# Patient Record
Sex: Male | Born: 1945 | Race: White | Hispanic: No | Marital: Married | Smoking: Never smoker
Health system: Southern US, Community
[De-identification: ages and names within clinical notes are randomized; demographics above are authoritative.]

## PROBLEM LIST (undated history)

## (undated) HISTORY — PX: KNEE RECONSTRUCTION: SHX5883

## (undated) HISTORY — PX: GALLBLADDER SURGERY: SHX652

---

## 1999-02-25 ENCOUNTER — Encounter: Payer: Self-pay | Admitting: Cardiology

## 1999-02-26 ENCOUNTER — Inpatient Hospital Stay (HOSPITAL_COMMUNITY): Admission: RE | Admit: 1999-02-26 | Discharge: 1999-03-03 | Payer: Self-pay | Admitting: Cardiology

## 1999-02-26 ENCOUNTER — Encounter: Payer: Self-pay | Admitting: Surgery

## 1999-02-27 ENCOUNTER — Encounter: Payer: Self-pay | Admitting: Surgery

## 1999-03-24 ENCOUNTER — Encounter: Admission: RE | Admit: 1999-03-24 | Discharge: 1999-03-24 | Payer: Self-pay | Admitting: Surgery

## 1999-03-24 ENCOUNTER — Encounter: Payer: Self-pay | Admitting: Surgery

## 2000-02-11 ENCOUNTER — Ambulatory Visit (HOSPITAL_COMMUNITY): Admission: RE | Admit: 2000-02-11 | Discharge: 2000-02-11 | Payer: Self-pay | Admitting: Family Medicine

## 2009-09-08 ENCOUNTER — Encounter: Admission: RE | Admit: 2009-09-08 | Discharge: 2009-09-08 | Payer: Self-pay | Admitting: Sports Medicine

## 2009-09-22 ENCOUNTER — Encounter: Admission: RE | Admit: 2009-09-22 | Discharge: 2009-09-22 | Payer: Self-pay | Admitting: Sports Medicine

## 2010-05-29 NOTE — Cardiovascular Report (Signed)
[place]. [facility]  Patient:    Lawrence Morton, Lawrence Morton                          MRN: [id number] Proc. Date: 02/25/99 Adm. Date:  [id number] Attending:  Lenoria Farrier CC:         Belva Crome, M.D., [place], [place]             Everardo Beals. Juanda Chance, M.D. [facility]             Cardiac Catheterization Laboratory                        Cardiac Catheterization  INDICATIONS:  Lawrence Morton is 65 years old and has a history of chest pain dating back at least a year.  He had a negative Cardiolite scan about one year ago. Recently, his symptoms have increased and he saw Dr. Tomie China in consultation, ho recommended that he be evaluated further with cardiac catheterization.  He does  have multiple risk factors for coronary disease, including hypertension, hyperlipidemia, and a positive family history for coronary artery disease.  DESCRIPTION OF PROCEDURE:  The procedure was performed via the right femoral artery using arterial sheath and 6-French preformed coronary catheters.  We had a great deal of difficulty accessing the right groin even though we could feel the pulse well.  We were able to get into the artery, but could not maintain brisk flow. We finally gained access and there was no immediate problem evident.  We used 6-French preformed coronary catheters and had a great deal of difficulty selectively engaging the left coronary without damping.  We used a JL-4 and a JL-5 7-French and a JL-4 and a JL-3.5 5-French.  Subclavian injection was performed to assess the  internal mammary artery for its suitability for bypass grafting.  Distal aortogram was performed to rule out abdominal aortic aneurysm.  RESULTS:  CORONARY ANGIOGRAPHY: 1. The left main coronary artery was a very short vessel which appeared to be free    of significant obstruction.  2. The left anterior descending artery gave rise to a large diagonal branch, a    large septal perforator, two small  diagonal branches, and several more septal    perforators.  There was 90% stenosis just after the first septal perforator ith    an aneurysmal dilatation just after the stenosis.  3. The circumflex artery gave rise to an early marginal branch and two    posterolateral branches.  There were 50% and 70% stenoses in the marginal    branch.  There was 90% stenosis in the mid circumflex artery and 80% stenosis    in the proximal portion of the second posterolateral branch.  4. The right coronary artery was completely occluded proximally.  There were    bridging collaterals which filled the distal right coronary artery and the    posterior descending and one posterolateral branch filled antegrade.  There    were collaterals of the distal right coronary artery from the circumflex and the    LAD which filled a second posterolateral branch and part of the first    posterolateral branch.  LEFT VENTRICULOGRAM:  The left ventriculogram was performed in the RAO projection and showed good wall motion with no areas of hypokinesis.  The estimated ejection fraction was 60%.  DISTAL AORTOGRAM:  An aortogram was performed which showed no renal artery stenosis and no significant aortoiliac obstruction.  CONCLUSIONS:  Coronary artery disease with 90% stenosis in the proximal left anterior descending artery, 70% stenosis in the first marginal branch, 90% stenosis in the mid circumflex artery and total occlusion of the right coronary artery with normal left ventricular function.  RECOMMENDATIONS:  The patient has severe three-vessel disease and is quite symptomatic with angina.  His situation is not favorable for a percutaneous intervention and I would recommend bypass surgery.  I will discuss this with Dr. Tomie China, the patient, and his family. DD:  02/25/99 TD:  02/25/99 Job: [id number]

## 2010-05-29 NOTE — Discharge Summary (Signed)
[place]. [facility] [place]  Patient:    Lawrence Morton, Lawrence Morton                          MRN: [id number] Adm. Date:  02/25/99 Disc. Date: 03/03/99 Attending:  Alleen Borne, M.D. Dictator:   Carlye Grippe. CC:         Dr. Laneta Simmers             Dr. Juanda Chance [facility])             Dr. Yetta Flock [facility])             Dr. Aliene Beams, [place]                           Discharge Summary  HISTORY OF PRESENT ILLNESS:  This is a 65 year old male with apparent several-year history of chest pain who underwent exercise Cardiolite approximately one year go, which was negative.  Recently, he has had increased symptoms of chest tightness and shortness of breath on exertion, and was felt to require admission for cardiac catheterization.  Cardiac risk factors include family history, hypertension, hyperlipidemia, and obesity.  PAST MEDICAL HISTORY: 1. Hypertension. 2. History of bladder cancer, status post surgical resection. 3. History of skin cancer. 4. History of sleep apnea, on a BiPAP mask.  ALLERGIES:  No known allergies.  ADMISSION MEDICATIONS: 1. Tricor 200 mg q.d. 2. Cozaar 50 mg q.d. 3. Enteric-coated aspirin 1 q.d. 4. ______ 1 q.d. 5. Vitamin E 400 IU 1 q.d. 6. Folic acid 1 q.d. 7. Multivitamin 1 q.d.  SOCIAL HISTORY, FAMILY HISTORY, REVIEW OF SYMPTOMS, PHYSICAL EXAMINATION: Please see the history and physical done at the time of admission.  HOSPITAL COURSE:  The patient was admitted electively on February 25, 1999, and was taken to the cardiac catheterization laboratory by Dr. Juanda Chance, where he was found to have severe three vessel coronary artery disease including 90% proximal, 70%  circumflex intermediate, 90% mid circumflex, total right coronary artery.  Left  ventriculogram was within normal limits.  Ejection fraction was approximately 60%. Cardiac surgical opinion was obtained with Evelene Croon, M.D., who evaluated the patient and his studies, and agreed  that coronary artery bypass grafting is the  best treatment for this patient.  The surgery, alternatives, benefits, and risks were discussed with the patient, wife, and daughter, and he agreed to proceed with surgery.  PROCEDURE:  On February 25, 1998, the patient underwent the following procedure: Coronary artery bypass grafting x 4.  The following grafts were placed: 1. Left internal mammary artery to the LAD. 2. Saphenous vein graft to the posterior descending. 3. Saphenous vein graft to the intermediate and obtuse marginal in a sequential    manner.  Cross-clamp time was 67 minutes, pump time 109 minutes.  The patient tolerated he procedure well, was taken to the surgical intensive care unit in stable condition.  POSTOPERATIVE HOSPITAL COURSE:  The patient has done well.  He has maintained stable hemodynamics.  Postoperatively, he did have some EKG changes and a friction rub consistent with pericarditis and was started on a short course of Indocin.  This has shown good resolution.  Additionally, the patient had some right hand numbness that was felt probably secondary to a nerve stretch injury from the retractor and, again, this has also showed improvement.  Laboratory values have  remained stable.  He does have a hemoglobin and hematocrit dated February 27, 1999, of 10 and 30.  Electrolytes, BUN, and creatinine have remained stable.  He is afebrile.  He has maintained a normal sinus rhythm.  Oxygen has been weaned, and he maintains good saturations on room air.  He has undergone a gentle diuresis, and now is at the level of his preoperative weight.  His incisions are healing well  without signs of infection.  He is tolerating diet and activities commensurate or postoperative state and, overall, is felt to be tentatively stable for discharge on the morning of March 03, 1999, pending morning round re-evaluation.  FINAL DIAGNOSES: 1. Multivessel coronary artery  disease. 2. Hypertension. 3. Hyperlipidemia. 4. History of bladder cancer. 5. History of sleep apnea.  DISCHARGE MEDICATIONS: 1. Vitamin E 400 IU q.d. 2. Folvite 1 mg daily. 3. Tricor 200 mg q.d. 4. Atenolol 12.5 mg b.i.d. 5. Enteric-coated aspirin 325 mg q.d. 6. Tylox 1 or 2 q.4-6h. p.r.n.  FOLLOW-UP:  Staple removal on March 09, 1999, at 10 a.m. at the [facility] office.  The patient will also need to see Dr. Tomie China in two weeks, and Dr. Laneta Simmers will see the patient on March 24, 1999, at 10:30 a.m., with a chest x-ray from either his cardiologist or the [facility].  CONDITION ON DISCHARGE:  Stable and improving.  DISCHARGE INSTRUCTIONS:  The patient will receive written instructions regarding medications, activity, diet, wound care, and follow up. DD:  03/02/99 TD:  03/02/99 Job: [id number]

## 2010-05-29 NOTE — Op Note (Signed)
[place]. [facility]  Patient:    Lawrence Morton, Lawrence Morton                          MRN: [id number] Proc. Date: 02/26/99 Adm. Date:  [id number] Attending:  Cleatrice Burke CC:         Alleen Borne, M.D., CVTS Office             Bruce R. Juanda Chance, M.D. [facility]             Cardiac Cath Lab, [place]                           Operative Report  PREOPERATIVE DIAGNOSIS:  Severe 3-vessel coronary disease with progressive angina.  POSTOPERATIVE DIAGNOSIS:  Severe 3-vessel coronary disease with progressive angina.  PROCEDURE:  Median sternotomy, extracorporeal circulation, coronary artery bypass graft surgery x 4 using left internal mammary artery graft to left anterior descending coronary artery, with a saphenous vein graft to the posterior descending branch of the right coronary artery and a sequential saphenous vein graft to the intermediate and obtuse marginal of the left circumflex coronary artery.  SURGEON:  Alleen Borne, M.D.  ASSISTANTLuretha Rued. Vedia Pereyra. and Lissa Merlin, P.A.-C  ANESTHESIA:  General endotracheal.  CLINICAL HISTORY:  This patient is a 65 year old white male with a history of hypertension and hyperlipidemia, as well as a positive family history of heart disease, who presented with a severe year history of chest pain associated with  shortness of breath with exertion.  These symptoms have progressed.  Cardiac catheterization showed 90% proximal LAD stenosis.  The left circumflex had a 70% intermediate stenosis and then a 90% mid vessel stenosis before the obtuse marginal or posterolateral branch.  The right coronary artery was occluded with filling f the distal vessel by collaterals from the left.  Left ventricular function was normal.  After review of the angiogram and examination of the patient, it was felt that coronary artery bypass graft surgery was the best treatment.  I discussed he operative procedure, alternatives to  surgery, benefits and risks, including bleeding, possible blood transfusion, infection, stroke, myocardial infarction nd death with the patient and his wife and they understood and agreed to proceed with surgery.  OPERATIVE PROCEDURE:  The patient was taken to the operating room, placed on the table in supine position.  After induction of general endotracheal anesthesia,  Foley catheter was placed in bladder using sterile technique.  Then, the chest,  abdomen and both lower extremities were prepped and draped in usual sterile manner. The chest was entered through a median sternotomy incision and the pericardium as opened in the midline.  Examination of the heart showed good ventricular contractility.  The ascending aorta was relatively short, but had no plaque in t.  Then, the left internal mammary artery was harvested from the chest wall as a pedicle graft.  This was a medium caliber vessel with excellent blood flow through it.  At the same time, a segment of greater saphenous vein was harvested from the right lower leg and this vein was of medium size and good quality.  Then, the patient was heparinized and when an adequate activated clotting time as achieved, the distal ascending aorta was cannulated using a 22-French aortic cannula for arterial inflow.  Venous outflow was achieved using a 2-stage venous cannula through the right atrial appendage.  An antegrade cardioplegia  and vent  cannula was inserted in the aortic root.  The patient was placed on cardiopulmonary bypass and distal coronaries identified. The LAD was a large graftable vessel.  The diagonal branch had no disease in it and was supplied by the normal left main coronary artery.  The intermediate and obtuse marginal vessels were medium sized graftable vessels.  The right coronary artery gave off a single posterior descending branch, which was suitable for grafting.  There was an acute marginal branch,  which was deep beneath the epicardial fat and was not felt to be suitable for grafting.  Then the aorta was cross-clamped and 500 cc of cold blood antegrade cardioplegia was administered in the aortic root with quick arrest of the heart.  Systemic hypothermia to 20 degrees Centigrade and topical hypothermia with iced saline was used.  A temperature probe was placed in the septum and insulating pad in the pericardium.  The first distal anastomosis was performed to the posterior descending coronary  artery.  The internal diameter was 1.6 mm.  The conduit used was a segment of greater saphenous vein and the anastomosis performed in an end-to-side manner using continuous 7-0 Prolene suture.  Flow is measured through the graft and was excellent.  A second distal anastomosis was performed to the intermediate coronary artery. The internal diameter was 1.6 mm.  The conduit used was a second segment of greater  saphenous vein and anastomosis was performed in a sequential side-to-side manner using continuous 7-0 Prolene suture.  Flow was measured through the graft and was excellent.  Then the third distal anastomosis was performed to the obtuse marginal branch.  The internal diameter was 1.6 mm.  The conduit used was the same segment of greater saphenous vein and the anastomosis performed in a sequential end-to-side manner  using continuous 7-0 Prolene suture.  Flow was measured through the graft and was excellent.  Then another dose of cardioplegia was given down the vein grafts and in the aortic root.  The fourth distal anastomosis was performed to the mid portion of the left anterior descending coronary artery.  The internal diameter was 1.75 mm.  The conduit used was the left internal mammary artery and this was brought through an opening in the left pericardium anterior to the phrenic nerve.  It was anastomosed to the LAD n an end-to-side manner using continuous 8-0  Prolene suture.  The pedicle was tacked to epicardium with 6-0 Prolene sutures.  The patient was rewarmed to 37 degrees   Centigrade and the clamp was removed from the mammary pedicle.  There was rapid  warming of ventricular septum and return of spontaneous ventricular fibrillation. The cross-clamp was removed with a time of 67 minutes and the patient defibrillated in sinus rhythm.  A partial occlusion clamp was placed on the aortic root and the two proximal vein graft anastomoses were performed in an end-to-side manner using continuous 6-0 Prolene suture.  The clamp was removed and the vein grafts deaired and the clamps removed from them.  The proximal and distal anastomoses appeared hemostatic and the lying of the grafts satisfactory.  Graft markers were placed around the proximal anastomoses.  Two temporary right ventricular and right atrial pacing wires were placed and brought out through the skin.  When the patient had rewarmed to 37 degrees Centigrade, he was weaned from cardiopulmonary bypass on no inotropic agents.  Total bypass time was 109 minutes. Cardiac function appeared excellent with a cardiac output of 7 L/min. Protamine was given  and the venous and aortic cannulae removed without difficulty. Hemostasis was achieved.  Three chest tubes were placed with a tube in the posterior pericardium, one in the left pleural space and one in the anterior mediastinum.  The pericardium was reapproximated over the heart.  The sternum was closed with #6 stainless steel wires.  Fascia was closed with continuous #1 Vicryl suture.  Subcutaneous tissue was closed using continuous 2-0 Vicryl and the skin with 3-0 Vicryl subcuticular closure.  Lower extremity vein harvest site was closed layers in a similar manner.  The sponge, needle and instrument counts were correct according to scrub nurse.  Dry sterile dressings were applied over the incisions around the chest tubes,  which were hooked to Pleur-evac suction.  The patient remained hemodynamically stable and was transported to the SICU in guarded, but  stable condition. DD:  02/26/99 TD:  02/26/99 Job: [id number]

## 2014-09-12 DIAGNOSIS — Z9989 Dependence on other enabling machines and devices: Secondary | ICD-10-CM

## 2014-09-12 DIAGNOSIS — M48061 Spinal stenosis, lumbar region without neurogenic claudication: Secondary | ICD-10-CM | POA: Insufficient documentation

## 2014-09-12 DIAGNOSIS — G63 Polyneuropathy in diseases classified elsewhere: Secondary | ICD-10-CM | POA: Insufficient documentation

## 2014-09-12 DIAGNOSIS — G729 Myopathy, unspecified: Secondary | ICD-10-CM | POA: Insufficient documentation

## 2014-09-12 DIAGNOSIS — R269 Unspecified abnormalities of gait and mobility: Secondary | ICD-10-CM | POA: Insufficient documentation

## 2014-09-12 DIAGNOSIS — G4733 Obstructive sleep apnea (adult) (pediatric): Secondary | ICD-10-CM | POA: Insufficient documentation

## 2015-10-29 DIAGNOSIS — Z9181 History of falling: Secondary | ICD-10-CM | POA: Insufficient documentation

## 2016-07-18 DIAGNOSIS — I2510 Atherosclerotic heart disease of native coronary artery without angina pectoris: Secondary | ICD-10-CM | POA: Insufficient documentation

## 2016-07-18 DIAGNOSIS — R0609 Other forms of dyspnea: Secondary | ICD-10-CM | POA: Insufficient documentation

## 2016-07-18 DIAGNOSIS — Z951 Presence of aortocoronary bypass graft: Secondary | ICD-10-CM | POA: Insufficient documentation

## 2017-07-13 ENCOUNTER — Ambulatory Visit (INDEPENDENT_AMBULATORY_CARE_PROVIDER_SITE_OTHER): Payer: Medicare Other | Admitting: Cardiology

## 2017-07-13 ENCOUNTER — Encounter: Payer: Self-pay | Admitting: Cardiology

## 2017-07-13 ENCOUNTER — Encounter (INDEPENDENT_AMBULATORY_CARE_PROVIDER_SITE_OTHER): Payer: Self-pay

## 2017-07-13 VITALS — BP 134/82 | HR 72 | Ht 70.0 in | Wt 271.0 lb

## 2017-07-13 DIAGNOSIS — Z9181 History of falling: Secondary | ICD-10-CM | POA: Diagnosis not present

## 2017-07-13 DIAGNOSIS — R269 Unspecified abnormalities of gait and mobility: Secondary | ICD-10-CM | POA: Diagnosis not present

## 2017-07-13 DIAGNOSIS — Z951 Presence of aortocoronary bypass graft: Secondary | ICD-10-CM | POA: Diagnosis not present

## 2017-07-13 DIAGNOSIS — I483 Typical atrial flutter: Secondary | ICD-10-CM | POA: Diagnosis not present

## 2017-07-13 DIAGNOSIS — I4892 Unspecified atrial flutter: Secondary | ICD-10-CM | POA: Insufficient documentation

## 2017-07-13 DIAGNOSIS — I2510 Atherosclerotic heart disease of native coronary artery without angina pectoris: Secondary | ICD-10-CM | POA: Diagnosis not present

## 2017-07-13 DIAGNOSIS — I480 Paroxysmal atrial fibrillation: Secondary | ICD-10-CM

## 2017-07-13 NOTE — Patient Instructions (Signed)
Medication Instructions:  Your physician recommends that you continue on your current medications as directed. Please refer to the Current Medication list given to you today.  Labwork: None  Testing/Procedures: Your physician has requested that you have an echocardiogram. Echocardiography is a painless test that uses sound waves to create images of your heart. It provides your doctor with information about the size and shape of your heart and how well your heart's chambers and valves are working. This procedure takes approximately one hour. There are no restrictions for this procedure.  Your physician has recommended that you wear a holter monitor. Holter monitors are medical devices that record the heart's electrical activity. Doctors most often use these monitors to diagnose arrhythmias. Arrhythmias are problems with the speed or rhythm of the heartbeat. The monitor is a small, portable device. You can wear one while you do your normal daily activities. This is usually used to diagnose what is causing palpitations/syncope (passing out).  Follow-Up: Your physician recommends that you schedule a follow-up appointment in: 3 weeks  Any Other Special Instructions Will Be Listed Below (If Applicable).     If you need a refill on your cardiac medications before your next appointment, please call your pharmacy.   [facility]  Garey HamAshley A, RN, BSN

## 2017-07-13 NOTE — Progress Notes (Signed)
Cardiology Office Note:    Date:  07/13/2017   ID:  [name], DOB 10/11/1945, MRN [id number]  PCP:  Hal Morales, NP  Cardiologist:  Gypsy Balsam, MD    Referring MD: No ref. provider found   No chief complaint on file. Doing well  History of Present Illness:    Lawrence Morton is a 72 y.o. male with coronary artery disease sleep apnea essential hypertension comes today to office for follow-up he also does have some neurological problem with his legs he cannot walk well denies having any chest pain tightness squeezing pressure burning chest.  EKG was done today surprisingly he is in atrial flutter he is completely unaware of it.  He does have frequent falls because of his neurological problem.  Therefore I do think he be candidate for anticoagulation.  History reviewed. No pertinent past medical history.  Past Surgical History:  Procedure Laterality Date  . GALLBLADDER SURGERY    . KNEE RECONSTRUCTION      Current Medications: Current Meds  Medication Sig  . aspirin EC 325 MG tablet Take 325 mg by mouth daily.   . clopidogrel (PLAVIX) 75 MG tablet Take 1 tablet by mouth daily.  . Coenzyme Q-10 200 MG CAPS Take 1 tablet by mouth daily.  . DOCOSAHEXAENOIC ACID PO Take 1 tablet by mouth daily.  . finasteride (PROSCAR) 5 MG tablet Take 1 tablet by mouth daily.  Marland Kitchen gabapentin (NEURONTIN) 300 MG capsule Take 300 mg by mouth daily.   . isosorbide mononitrate (IMDUR) 60 MG 24 hr tablet Take 1 tablet by mouth daily.  Marland Kitchen levothyroxine (SYNTHROID, LEVOTHROID) 50 MCG tablet Take 1 tablet by mouth daily.  Marland Kitchen lisinopril (PRINIVIL,ZESTRIL) 20 MG tablet Take 10 mg by mouth daily.  . metoprolol succinate (TOPROL-XL) 25 MG 24 hr tablet Take 1 tablet by mouth daily.  . montelukast (SINGULAIR) 10 MG tablet Take 1 tablet by mouth daily.  . Multiple Vitamin (MULTIVITAMIN) capsule Take 1 capsule by mouth daily.  . nitroGLYCERIN (NITROSTAT) 0.4 MG SL tablet Place 1 tablet under the tongue as  needed for chest pain.  . ranolazine (RANEXA) 1000 MG SR tablet Take 1 tablet by mouth daily.  . saxagliptin HCl (ONGLYZA) 5 MG TABS tablet Take 1 tablet by mouth daily.  . simvastatin (ZOCOR) 20 MG tablet Take 1 tablet by mouth daily.  . temazepam (RESTORIL) 15 MG capsule Take 1 capsule by mouth daily.     Allergies:   Patient has no known allergies.   Social History   Socioeconomic History  . Marital status: Married    Spouse name: Not on file  . Number of children: Not on file  . Years of education: Not on file  . Highest education level: Not on file  Occupational History  . Not on file  Social Needs  . Financial resource strain: Not on file  . Food insecurity:    Worry: Not on file    Inability: Not on file  . Transportation needs:    Medical: Not on file    Non-medical: Not on file  Tobacco Use  . Smoking status: Never Smoker  . Smokeless tobacco: Never Used  Substance and Sexual Activity  . Alcohol use: Not on file  . Drug use: Not on file  . Sexual activity: Not on file  Lifestyle  . Physical activity:    Days per week: Not on file    Minutes per session: Not on file  . Stress: Not on  file  Relationships  . Social connections:    Talks on phone: Not on file    Gets together: Not on file    Attends religious service: Not on file    Active member of club or organization: Not on file    Attends meetings of clubs or organizations: Not on file    Relationship status: Not on file  Other Topics Concern  . Not on file  Social History Narrative  . Not on file     Family History: The patient's family history includes Depression in his sister; Hypertension in his father, mother, and sister. ROS:   Please see the history of present illness.    All 14 point review of systems negative except as described per history of present illness  EKGs/Labs/Other Studies Reviewed:      Recent Labs: No results found for requested labs within last 8760 hours.  Recent Lipid  Panel No results found for: CHOL, TRIG, HDL, CHOLHDL, VLDL, LDLCALC, LDLDIRECT  Physical Exam:    VS:  BP 134/82 (BP Location: Right Arm, Patient Position: Sitting, Cuff Size: Normal)   Pulse 72   Ht 5\' 10"  (1.778 m)   Wt 271 lb (122.9 kg)   SpO2 99%   BMI 38.88 kg/m     Wt Readings from Last 3 Encounters:  07/13/17 271 lb (122.9 kg)     GEN:  Well nourished, well developed in no acute distress HEENT: Normal NECK: No JVD; No carotid bruits LYMPHATICS: No lymphadenopathy CARDIAC: RRR, no murmurs, no rubs, no gallops RESPIRATORY:  Clear to auscultation without rales, wheezing or rhonchi  ABDOMEN: Soft, non-tender, non-distended MUSCULOSKELETAL:  No edema; No deformity  SKIN: Warm and dry LOWER EXTREMITIES: no swelling NEUROLOGIC:  Alert and oriented x 3 PSYCHIATRIC:  Normal affect   ASSESSMENT:    1. Coronary artery disease involving native coronary artery of native heart without angina pectoris   2. Typical atrial flutter (HCC)   3. Hx of CABG   4. Gait difficulty   5. Risk for falls    PLAN:    In order of problems listed above:  1. Coronary artery disease stable on appropriate medications I reviewed all laboratory tests and data from a different office apparently he had a stress test which showed small area of ischemia medical therapy has been recommended.  He has been doing well with it and I will continue with it. 2. Atrial flutter which is incidental discovery.  He is asymptomatic.  Rate is controlled I will ask him to come back to my office in about 2 to 3 weeks to see if he still in atrial flutter at that time we talked about options for this situation again likely his ventricular rate is controlled.  I do not think he would be a good candidate for anticoagulation in spite of high chads 2 Vascor because of frequent falls we may consider watchman device in the future. 3. History of coronary artery disease status post coronary artery bypass graft doing well from that  point review 4. Gait difficulties with risk of falls being followed by neurology.  I will ask her to have echocardiogram.  I will bring him back to my office in 3 weeks.  Holter monitor for 48 hours will be put as well to assess high rate variability.  He described to episode of falls not recently I will make sure there is no significant slowing down his heart rate.   Medication Adjustments/Labs and Tests Ordered: Current medicines  are reviewed at length with the patient today.  Concerns regarding medicines are outlined above.  No orders of the defined types were placed in this encounter.  Medication changes: No orders of the defined types were placed in this encounter.   Signed, Georgeanna Lea, MD, [facility] 07/13/2017 4:24 PM    [facility]

## 2017-07-25 ENCOUNTER — Ambulatory Visit (INDEPENDENT_AMBULATORY_CARE_PROVIDER_SITE_OTHER): Payer: Medicare Other

## 2017-07-25 DIAGNOSIS — I480 Paroxysmal atrial fibrillation: Secondary | ICD-10-CM

## 2017-07-28 ENCOUNTER — Ambulatory Visit (INDEPENDENT_AMBULATORY_CARE_PROVIDER_SITE_OTHER): Payer: Medicare Other

## 2017-07-28 ENCOUNTER — Other Ambulatory Visit: Payer: Self-pay

## 2017-07-28 DIAGNOSIS — I480 Paroxysmal atrial fibrillation: Secondary | ICD-10-CM

## 2017-07-28 MED ORDER — PERFLUTREN LIPID MICROSPHERE
10.0000 mL | INTRAVENOUS | Status: AC | PRN
  Administered 2017-07-28: 10 mL via INTRAVENOUS

## 2017-07-28 NOTE — Progress Notes (Signed)
Complete echocardiogram with contrast was performed.   Jimmy Twania Bujak RDCS 

## 2017-07-29 ENCOUNTER — Encounter (INDEPENDENT_AMBULATORY_CARE_PROVIDER_SITE_OTHER): Payer: Self-pay

## 2017-07-31 ENCOUNTER — Encounter: Payer: Self-pay | Admitting: Cardiology

## 2017-07-31 DIAGNOSIS — R001 Bradycardia, unspecified: Secondary | ICD-10-CM | POA: Diagnosis not present

## 2017-07-31 DIAGNOSIS — I2510 Atherosclerotic heart disease of native coronary artery without angina pectoris: Secondary | ICD-10-CM | POA: Diagnosis not present

## 2017-07-31 DIAGNOSIS — R4182 Altered mental status, unspecified: Secondary | ICD-10-CM

## 2017-07-31 DIAGNOSIS — E871 Hypo-osmolality and hyponatremia: Secondary | ICD-10-CM | POA: Diagnosis not present

## 2017-07-31 DIAGNOSIS — I4892 Unspecified atrial flutter: Secondary | ICD-10-CM

## 2017-07-31 DIAGNOSIS — E119 Type 2 diabetes mellitus without complications: Secondary | ICD-10-CM | POA: Diagnosis not present

## 2017-08-01 DIAGNOSIS — R001 Bradycardia, unspecified: Secondary | ICD-10-CM | POA: Diagnosis not present

## 2017-08-01 DIAGNOSIS — I4892 Unspecified atrial flutter: Secondary | ICD-10-CM | POA: Diagnosis not present

## 2017-08-01 DIAGNOSIS — R4182 Altered mental status, unspecified: Secondary | ICD-10-CM | POA: Diagnosis not present

## 2017-08-03 ENCOUNTER — Ambulatory Visit (INDEPENDENT_AMBULATORY_CARE_PROVIDER_SITE_OTHER): Payer: Medicare Other | Admitting: Cardiology

## 2017-08-03 ENCOUNTER — Encounter: Payer: Self-pay | Admitting: Cardiology

## 2017-08-03 VITALS — BP 116/64 | HR 79 | Ht 70.0 in | Wt 273.0 lb

## 2017-08-03 DIAGNOSIS — I484 Atypical atrial flutter: Secondary | ICD-10-CM

## 2017-08-03 DIAGNOSIS — Z951 Presence of aortocoronary bypass graft: Secondary | ICD-10-CM | POA: Diagnosis not present

## 2017-08-03 DIAGNOSIS — Z9989 Dependence on other enabling machines and devices: Secondary | ICD-10-CM

## 2017-08-03 DIAGNOSIS — I2510 Atherosclerotic heart disease of native coronary artery without angina pectoris: Secondary | ICD-10-CM

## 2017-08-03 DIAGNOSIS — R404 Transient alteration of awareness: Secondary | ICD-10-CM

## 2017-08-03 DIAGNOSIS — G4733 Obstructive sleep apnea (adult) (pediatric): Secondary | ICD-10-CM | POA: Diagnosis not present

## 2017-08-03 DIAGNOSIS — Z9181 History of falling: Secondary | ICD-10-CM

## 2017-08-03 DIAGNOSIS — R4182 Altered mental status, unspecified: Secondary | ICD-10-CM | POA: Insufficient documentation

## 2017-08-03 NOTE — Progress Notes (Signed)
Cardiology Office Note:    Date:  08/03/2017   ID:  Lawrence Morton, DOB [birth year], MRN [id number]  PCP:  Hal Morales, NP  Cardiologist:  Gypsy Balsam, MD    Referring MD: Hal Morales, NP   Chief Complaint  Patient presents with  . 3 week follow up  I was in the hospital  History of Present Illness:    Lawrence Morton is a 72 y.o. male with atrial flutter, coronary artery disease, status post coronary artery bypass graft, obstructive sleep apnea comes to my office after being in the hospital a week and actually I did see him on Sunday he came because of altered mental status he was at the church she was sitting and then he  stop responding.  He was sitting on the chair never fell off the chair he he did have his eyes open his wife is going to talk to him he did not respond entire episode lasted for about 15 to 20 minutes EMS came and he was brought to the hospital in the hospital he was find to be in atrial flutter with slow ventricular rate and this is an issue that I interrogated and investigated previously he did have echocardiogram done which showed normal left ventricular ejection fraction.  He also got a Holter monitor that he wear for 48 hours lowest heart rate was 3 3, average 55 bpm overall slow and while in the hospital we discontinue his metoprolol he was taking only 12.5 twice daily since that time he is doing well denies have any chest pain tightness squeezing pressure burning the chest.  No past medical history on file.  Past Surgical History:  Procedure Laterality Date  . GALLBLADDER SURGERY    . KNEE RECONSTRUCTION      Current Medications: Current Meds  Medication Sig  . aspirin EC 325 MG tablet Take 325 mg by mouth daily.   . clopidogrel (PLAVIX) 75 MG tablet Take 1 tablet by mouth daily.  . Coenzyme Q-10 200 MG CAPS Take 1 tablet by mouth daily.  . DOCOSAHEXAENOIC ACID PO Take 1 tablet by mouth daily.  . finasteride (PROSCAR) 5 MG tablet Take 1 tablet by  mouth daily.  Marland Kitchen gabapentin (NEURONTIN) 300 MG capsule Take 300 mg by mouth daily.   . isosorbide mononitrate (IMDUR) 60 MG 24 hr tablet Take 1 tablet by mouth daily.  Marland Kitchen levothyroxine (SYNTHROID, LEVOTHROID) 50 MCG tablet Take 1 tablet by mouth daily.  Marland Kitchen lisinopril (PRINIVIL,ZESTRIL) 20 MG tablet Take 10 mg by mouth daily.  . montelukast (SINGULAIR) 10 MG tablet Take 1 tablet by mouth daily.  . Multiple Vitamin (MULTIVITAMIN) capsule Take 1 capsule by mouth daily.  . nitroGLYCERIN (NITROSTAT) 0.4 MG SL tablet Place 1 tablet under the tongue as needed for chest pain.  . ranolazine (RANEXA) 1000 MG SR tablet Take 1 tablet by mouth daily.  . saxagliptin HCl (ONGLYZA) 5 MG TABS tablet Take 1 tablet by mouth daily.  . simvastatin (ZOCOR) 20 MG tablet Take 1 tablet by mouth daily.  . temazepam (RESTORIL) 15 MG capsule Take 1 capsule by mouth daily.     Allergies:   Patient has no known allergies.   Social History   Socioeconomic History  . Marital status: Married    Spouse name: Not on file  . Number of children: Not on file  . Years of education: Not on file  . Highest education level: Not on file  Occupational History  . Not on  file  Social Needs  . Financial resource strain: Not on file  . Food insecurity:    Worry: Not on file    Inability: Not on file  . Transportation needs:    Medical: Not on file    Non-medical: Not on file  Tobacco Use  . Smoking status: Never Smoker  . Smokeless tobacco: Never Used  Substance and Sexual Activity  . Alcohol use: Not on file  . Drug use: Not on file  . Sexual activity: Not on file  Lifestyle  . Physical activity:    Days per week: Not on file    Minutes per session: Not on file  . Stress: Not on file  Relationships  . Social connections:    Talks on phone: Not on file    Gets together: Not on file    Attends religious service: Not on file    Active member of club or organization: Not on file    Attends meetings of clubs or  organizations: Not on file    Relationship status: Not on file  Other Topics Concern  . Not on file  Social History Narrative  . Not on file     Family History: The patient's family history includes Depression in his sister; Hypertension in his father, mother, and sister. ROS:   Please see the history of present illness.    All 14 point review of systems negative except as described per history of present illness  EKGs/Labs/Other Studies Reviewed:      Recent Labs: No results found for requested labs within last 8760 hours.  Recent Lipid Panel No results found for: CHOL, TRIG, HDL, CHOLHDL, VLDL, LDLCALC, LDLDIRECT  Physical Exam:    VS:  BP 116/64   Pulse 79   Ht 5\' 10"  (1.778 m)   Wt 273 lb (123.8 kg)   SpO2 98%   BMI 39.17 kg/m     Wt Readings from Last 3 Encounters:  08/03/17 273 lb (123.8 kg)  07/13/17 271 lb (122.9 kg)     GEN:  Well nourished, well developed in no acute distress HEENT: Normal NECK: No JVD; No carotid bruits LYMPHATICS: No lymphadenopathy CARDIAC: RRR, no murmurs, no rubs, no gallops RESPIRATORY:  Clear to auscultation without rales, wheezing or rhonchi  ABDOMEN: Soft, non-tender, non-distended MUSCULOSKELETAL:  No edema; No deformity  SKIN: Warm and dry LOWER EXTREMITIES: no swelling NEUROLOGIC:  Alert and oriented x 3 PSYCHIATRIC:  Normal affect   ASSESSMENT:    1. Atypical atrial flutter (HCC)   2. Coronary artery disease involving native coronary artery of native heart without angina pectoris   3. OSA on CPAP   4. Hx of CABG   5. Risk for falls   6. Transient alteration of awareness    PLAN:    In order of problems listed above:  1. Atypical atrial flutter rate appears to be controlled I will do EKG today to confirm the rhythm obviously he need to be anticoagulated because but because of unsteady gait and frequent falls we cannot do it I started conversation about watchman device with him.  He is taking aspirin as well as  Plavix I will continue we will repeat his EKG today and I will see him back in my office in about 1 month. 2. Altered mental status.  He is scheduled to see neurology what I think is an excellent idea.  We discontinue beta-blocker to eliminate one potential reason for his symptoms meaning significant bradycardia typically with bradycardia  people have more abrupt episodes rather than being unaware of surrounding for about 20 minutes. History of CABG doing well from that point review Risk of fall: Not anticoagulated.   Medication Adjustments/Labs and Tests Ordered: Current medicines are reviewed at length with the patient today.  Concerns regarding medicines are outlined above.  No orders of the defined types were placed in this encounter.  Medication changes: No orders of the defined types were placed in this encounter.   Signed, Georgeanna Leaobert J. Krasowski, MD, [facility] 08/03/2017 3:49 PM    [facility]

## 2017-08-03 NOTE — Patient Instructions (Signed)
Medication Instructions:  Your physician recommends that you continue on your current medications as directed. Please refer to the Current Medication list given to you today.  Labwork: None  Testing/Procedures: None  Follow-Up: Your physician recommends that you schedule a follow-up appointment in: 1 months  Any Other Special Instructions Will Be Listed Below (If Applicable).     If you need a refill on your cardiac medications before your next appointment, please call your pharmacy.   [facility]  Garey HamAshley A, RN, BSN

## 2017-09-05 ENCOUNTER — Ambulatory Visit: Payer: Medicare Other | Admitting: Cardiology

## 2017-09-05 ENCOUNTER — Encounter: Payer: Self-pay | Admitting: Cardiology

## 2017-09-05 VITALS — BP 126/58 | HR 68 | Ht 70.0 in | Wt 268.8 lb

## 2017-09-05 DIAGNOSIS — I2510 Atherosclerotic heart disease of native coronary artery without angina pectoris: Secondary | ICD-10-CM

## 2017-09-05 DIAGNOSIS — Z9181 History of falling: Secondary | ICD-10-CM | POA: Diagnosis not present

## 2017-09-05 DIAGNOSIS — I484 Atypical atrial flutter: Secondary | ICD-10-CM | POA: Diagnosis not present

## 2017-09-05 DIAGNOSIS — Z951 Presence of aortocoronary bypass graft: Secondary | ICD-10-CM | POA: Diagnosis not present

## 2017-09-05 NOTE — Progress Notes (Signed)
Cardiology Office Note:    Date:  09/05/2017   ID:  Lawrence Morton, DOB 01/08/1946, MRN [id number]  PCP:  Hal Morales, NP  Cardiologist:  Gypsy Balsam, MD    Referring MD: Hal Morales, NP   Chief Complaint  Patient presents with  . 1 month follow up  Doing well  History of Present Illness:    Lawrence Morton is a 72 y.o. male with coronary artery disease, atrial flutter.  He also has recent TIA.  He is on aspirin as well as Plavix ideally need to be anticoagulated because of high chads 2 Vascor but because of unsteady gait and frequent falls this is the case.  Denies having a tightness squeezing pressure branches described to have some atypical pain on the left side it happened when he walks to be.  Last time I saw him we spoke about watchman device I wanted him to think about it today he does not remember anything about the conversation.  We started talking about this again I gave him brochure and asked him to think about it.  I explained to him the indications for this device and potential complications.  He is not ready to make a decision today.  No past medical history on file.  Past Surgical History:  Procedure Laterality Date  . GALLBLADDER SURGERY    . KNEE RECONSTRUCTION      Current Medications: Current Meds  Medication Sig  . aspirin EC 325 MG tablet Take 325 mg by mouth daily.   . clopidogrel (PLAVIX) 75 MG tablet Take 1 tablet by mouth daily.  . Coenzyme Q-10 200 MG CAPS Take 1 tablet by mouth daily.  . DOCOSAHEXAENOIC ACID PO Take 1 tablet by mouth daily.  . finasteride (PROSCAR) 5 MG tablet Take 1 tablet by mouth daily.  Marland Kitchen gabapentin (NEURONTIN) 300 MG capsule Take 300 mg by mouth daily.   . isosorbide mononitrate (IMDUR) 60 MG 24 hr tablet Take 1 tablet by mouth daily.  Marland Kitchen levothyroxine (SYNTHROID, LEVOTHROID) 50 MCG tablet Take 1 tablet by mouth daily.  Marland Kitchen lisinopril (PRINIVIL,ZESTRIL) 20 MG tablet Take 10 mg by mouth daily.  . montelukast (SINGULAIR) 10 MG  tablet Take 1 tablet by mouth daily.  . Multiple Vitamin (MULTIVITAMIN) capsule Take 1 capsule by mouth daily.  . nitroGLYCERIN (NITROSTAT) 0.4 MG SL tablet Place 1 tablet under the tongue as needed for chest pain.  . ranolazine (RANEXA) 1000 MG SR tablet Take 1 tablet by mouth daily.  . saxagliptin HCl (ONGLYZA) 5 MG TABS tablet Take 1 tablet by mouth daily.  . simvastatin (ZOCOR) 20 MG tablet Take 1 tablet by mouth daily.  . temazepam (RESTORIL) 15 MG capsule Take 1 capsule by mouth daily.     Allergies:   Patient has no known allergies.   Social History   Socioeconomic History  . Marital status: Married    Spouse name: Not on file  . Number of children: Not on file  . Years of education: Not on file  . Highest education level: Not on file  Occupational History  . Not on file  Social Needs  . Financial resource strain: Not on file  . Food insecurity:    Worry: Not on file    Inability: Not on file  . Transportation needs:    Medical: Not on file    Non-medical: Not on file  Tobacco Use  . Smoking status: Never Smoker  . Smokeless tobacco: Never Used  Substance and  Sexual Activity  . Alcohol use: Not on file  . Drug use: Not on file  . Sexual activity: Not on file  Lifestyle  . Physical activity:    Days per week: Not on file    Minutes per session: Not on file  . Stress: Not on file  Relationships  . Social connections:    Talks on phone: Not on file    Gets together: Not on file    Attends religious service: Not on file    Active member of club or organization: Not on file    Attends meetings of clubs or organizations: Not on file    Relationship status: Not on file  Other Topics Concern  . Not on file  Social History Narrative  . Not on file     Family History: The patient's family history includes Depression in his sister; Hypertension in his father, mother, and sister. ROS:   Please see the history of present illness.    All 14 point review of systems  negative except as described per history of present illness  EKGs/Labs/Other Studies Reviewed:      Recent Labs: No results found for requested labs within last 8760 hours.  Recent Lipid Panel No results found for: CHOL, TRIG, HDL, CHOLHDL, VLDL, LDLCALC, LDLDIRECT  Physical Exam:    VS:  BP (!) 126/58   Pulse 68   Ht 5\' 10"  (1.778 m)   Wt 268 lb 12.8 oz (121.9 kg)   SpO2 98%   BMI 38.57 kg/m     Wt Readings from Last 3 Encounters:  09/05/17 268 lb 12.8 oz (121.9 kg)  08/03/17 273 lb (123.8 kg)  07/13/17 271 lb (122.9 kg)     GEN:  Well nourished, well developed in no acute distress HEENT: Normal NECK: No JVD; No carotid bruits LYMPHATICS: No lymphadenopathy CARDIAC: RRR, no murmurs, no rubs, no gallops RESPIRATORY:  Clear to auscultation without rales, wheezing or rhonchi  ABDOMEN: Soft, non-tender, non-distended MUSCULOSKELETAL:  No edema; No deformity  SKIN: Warm and dry LOWER EXTREMITIES: no swelling NEUROLOGIC:  Alert and oriented x 3 PSYCHIATRIC:  Normal affect   ASSESSMENT:    1. Coronary artery disease involving native coronary artery of native heart without angina pectoris   2. Atypical atrial flutter (HCC)   3. Hx of CABG   4. Risk for falls    PLAN:    In order of problems listed above:  1. Coronary artery disease stable on appropriate medications which I will continue. 2. Atypical atrial flutter, conversation about watchman device. 3. History of coronary artery bypass graft doing well from that point review 4. Risk of fall which prevented us from eating him anticoagulation in spite of high chads 2 Vascor.  See him back in my office in about 2 months or sooner if he get a problem   Medication Adjustments/Labs and Tests Ordered: Current medicines are reviewed at length with the patient today.  Concerns regarding medicines are outlined above.  No orders of the defined types were placed in this encounter.  Medication changes: No orders of the  defined types were placed in this encounter.   Signed, Georgeanna Leaobert J. Zarif Rathje, MD, [facility] 09/05/2017 4:00 PM    [facility]

## 2017-09-05 NOTE — Patient Instructions (Signed)
Medication Instructions:  Your physician recommends that you continue on your current medications as directed. Please refer to the Current Medication list given to you today.   Labwork: None  Testing/Procedures: None  Follow-Up: Your physician recommends that you schedule a follow-up appointment in: 2 months.  If you need a refill on your cardiac medications before your next appointment, please call your pharmacy.   Thank you for choosing [facility]! Tan Clopper, RN [phone]    

## 2017-11-08 ENCOUNTER — Encounter: Payer: Self-pay | Admitting: Cardiology

## 2017-11-08 ENCOUNTER — Ambulatory Visit: Payer: Medicare Other | Admitting: Cardiology

## 2017-11-08 VITALS — BP 122/54 | HR 89 | Ht 70.0 in | Wt 262.0 lb

## 2017-11-08 DIAGNOSIS — Z951 Presence of aortocoronary bypass graft: Secondary | ICD-10-CM

## 2017-11-08 DIAGNOSIS — G63 Polyneuropathy in diseases classified elsewhere: Secondary | ICD-10-CM | POA: Diagnosis not present

## 2017-11-08 DIAGNOSIS — I484 Atypical atrial flutter: Secondary | ICD-10-CM

## 2017-11-08 DIAGNOSIS — I2510 Atherosclerotic heart disease of native coronary artery without angina pectoris: Secondary | ICD-10-CM

## 2017-11-08 DIAGNOSIS — Z9181 History of falling: Secondary | ICD-10-CM

## 2017-11-08 NOTE — Progress Notes (Signed)
Cardiology Office Note:    Date:  11/08/2017   ID:  CHAY MAZZONI, DOB [birth year], MRN [id number]  PCP:  Hal Morales, NP  Cardiologist:  Gypsy Balsam, MD    Referring MD: Hal Morales, NP   Chief Complaint  Patient presents with  . 2 month follow up  Doing fair  History of Present Illness:    Lawrence Morton is a 72 y.o. male with coronary artery disease, proximal atrial flutter which is atypical.  He does have frequent falls because of some neurological as well as muscular problem.  He is having multiple bruises on his arm and complains about it.  We continue discussion about potentially having watchman device implanted.  He still cannot commit to it.  Again we discussed the reasoning for it and the risk of stroke the fact that the Plavix and aspirin does not completely protect him from having stroke he still not ready to make a decision.  Otherwise seems to be doing well no chest pain tightness squeezing pressure burning chest  No past medical history on file.  Past Surgical History:  Procedure Laterality Date  . GALLBLADDER SURGERY    . KNEE RECONSTRUCTION      Current Medications: Current Meds  Medication Sig  . aspirin EC 325 MG tablet Take 325 mg by mouth daily.   . clopidogrel (PLAVIX) 75 MG tablet Take 1 tablet by mouth daily.  . Coenzyme Q-10 200 MG CAPS Take 1 tablet by mouth daily.  . DOCOSAHEXAENOIC ACID PO Take 1 tablet by mouth daily.  . finasteride (PROSCAR) 5 MG tablet Take 1 tablet by mouth daily.  Marland Kitchen gabapentin (NEURONTIN) 300 MG capsule Take 300 mg by mouth daily.   . isosorbide mononitrate (IMDUR) 60 MG 24 hr tablet Take 1 tablet by mouth daily.  Marland Kitchen levothyroxine (SYNTHROID, LEVOTHROID) 75 MCG tablet Take 1 tablet by mouth daily.   Marland Kitchen lisinopril (PRINIVIL,ZESTRIL) 5 MG tablet Take 5 mg by mouth daily.   . montelukast (SINGULAIR) 10 MG tablet Take 1 tablet by mouth daily.  . Multiple Vitamin (MULTIVITAMIN) capsule Take 1 capsule by mouth daily.  .  nitroGLYCERIN (NITROSTAT) 0.4 MG SL tablet Place 1 tablet under the tongue as needed for chest pain.  . ranolazine (RANEXA) 1000 MG SR tablet Take 1 tablet by mouth daily.  . simvastatin (ZOCOR) 20 MG tablet Take 1 tablet by mouth daily.  . temazepam (RESTORIL) 15 MG capsule Take 1 capsule by mouth daily.     Allergies:   Patient has no known allergies.   Social History   Socioeconomic History  . Marital status: Married    Spouse name: Not on file  . Number of children: Not on file  . Years of education: Not on file  . Highest education level: Not on file  Occupational History  . Not on file  Social Needs  . Financial resource strain: Not on file  . Food insecurity:    Worry: Not on file    Inability: Not on file  . Transportation needs:    Medical: Not on file    Non-medical: Not on file  Tobacco Use  . Smoking status: Never Smoker  . Smokeless tobacco: Never Used  Substance and Sexual Activity  . Alcohol use: Not on file  . Drug use: Not on file  . Sexual activity: Not on file  Lifestyle  . Physical activity:    Days per week: Not on file    Minutes per session:  Not on file  . Stress: Not on file  Relationships  . Social connections:    Talks on phone: Not on file    Gets together: Not on file    Attends religious service: Not on file    Active member of club or organization: Not on file    Attends meetings of clubs or organizations: Not on file    Relationship status: Not on file  Other Topics Concern  . Not on file  Social History Narrative  . Not on file     Family History: The patient's family history includes Depression in his sister; Hypertension in his father, mother, and sister. ROS:   Please see the history of present illness.    All 14 point review of systems negative except as described per history of present illness  EKGs/Labs/Other Studies Reviewed:    EKG done today show atrial flutter with controlled ventricular rate.  Recent Labs: No  results found for requested labs within last 8760 hours.  Recent Lipid Panel No results found for: CHOL, TRIG, HDL, CHOLHDL, VLDL, LDLCALC, LDLDIRECT  Physical Exam:    VS:  BP (!) 122/54   Pulse 89   Ht 5\' 10"  (1.778 m)   Wt 262 lb (118.8 kg)   SpO2 99%   BMI 37.59 kg/m     Wt Readings from Last 3 Encounters:  11/08/17 262 lb (118.8 kg)  09/05/17 268 lb 12.8 oz (121.9 kg)  08/03/17 273 lb (123.8 kg)     GEN:  Well nourished, well developed in no acute distress HEENT: Normal NECK: No JVD; No carotid bruits LYMPHATICS: No lymphadenopathy CARDIAC: RRR, no murmurs, no rubs, no gallops RESPIRATORY:  Clear to auscultation without rales, wheezing or rhonchi  ABDOMEN: Soft, non-tender, non-distended MUSCULOSKELETAL:  No edema; No deformity  SKIN: Warm and dry LOWER EXTREMITIES: no swelling NEUROLOGIC:  Alert and oriented x 3 PSYCHIATRIC:  Normal affect   ASSESSMENT:    1. Atypical atrial flutter (HCC)   2. Coronary artery disease involving native coronary artery of native heart without angina pectoris   3. Polyneuropathy associated with underlying disease (HCC)   4. Hx of CABG   5. Risk for falls    PLAN:    In order of problems listed above:  1. Atypical atrial flutter we will do EKG today to check the rhythm.  Again discussion about watchman device continue. 2. Coronary artery disease stable asymptomatic continue present management. 3. Risk of fall and he does have frequent falls which prevented [place] from using full anticoagulation which is needed with his chads 2 Vascor being 4.  He did have symptoms of TIA.   Medication Adjustments/Labs and Tests Ordered: Current medicines are reviewed at length with the patient today.  Concerns regarding medicines are outlined above.  No orders of the defined types were placed in this encounter.  Medication changes: No orders of the defined types were placed in this encounter.   Signed, Georgeanna Lea, MD, [facility] 11/08/2017  11:03 AM    [facility]

## 2017-11-08 NOTE — Patient Instructions (Signed)
Medication Instructions:  Your physician recommends that you continue on your current medications as directed. Please refer to the Current Medication list given to you today.  If you need a refill on your cardiac medications before your next appointment, please call your pharmacy.   Lab work: None.  If you have labs (blood work) drawn today and your tests are completely normal, you will receive your results only by: . MyChart Message (if you have MyChart) OR . A paper copy in the mail If you have any lab test that is abnormal or we need to change your treatment, we will call you to review the results.  Testing/Procedures: None.   Follow-Up: At [facility], you and your health needs are our priority.  As part of our continuing mission to provide you with exceptional heart care, we have created designated Provider Care Teams.  These Care Teams include your primary Cardiologist (physician) and Advanced Practice Providers (APPs -  Physician Assistants and Nurse Practitioners) who all work together to provide you with the care you need, when you need it. You will need a follow up appointment in 3 months.  Please call our office 2 months in advance to schedule this appointment.  You may see No primary care provider on file. or another member of our [facility] Provider Team in [place]: Brian Munley, MD . Rajan Revankar, MD  Any Other Special Instructions Will Be Listed Below (If Applicable).     

## 2018-05-08 ENCOUNTER — Telehealth: Payer: Self-pay | Admitting: Cardiology

## 2018-05-08 NOTE — Telephone Encounter (Signed)
[place], NP at [facility] called to request we see Lawrence Morton [birth year] as soon as possible.  He became unresponsive Saturday for about 10 minutes.  EMS was called but by the time they got there, he had come around.  They checked him but did not transport him to ER but they said they felt like it was a cardiac issue.  I scheduled him for a virtual visit Thursday at 1:15, even though it is only a 15 minute slot but it was all I could find. However,  Delice Bison, his primary doctor wanted to know if we could see him sooner because she is concerned about this episode.  Dr Vanetta Shawl RN and CMA will take care of getting him a sooner appt

## 2018-05-08 NOTE — Telephone Encounter (Signed)
Don't forget we need to work in [employer]

## 2018-05-09 ENCOUNTER — Other Ambulatory Visit: Payer: Self-pay

## 2018-05-09 ENCOUNTER — Telehealth (INDEPENDENT_AMBULATORY_CARE_PROVIDER_SITE_OTHER): Payer: Medicare Other | Admitting: Cardiology

## 2018-05-09 ENCOUNTER — Encounter: Payer: Self-pay | Admitting: Cardiology

## 2018-05-09 VITALS — BP 118/53 | HR 59 | Wt 249.0 lb

## 2018-05-09 DIAGNOSIS — I2510 Atherosclerotic heart disease of native coronary artery without angina pectoris: Secondary | ICD-10-CM

## 2018-05-09 DIAGNOSIS — G63 Polyneuropathy in diseases classified elsewhere: Secondary | ICD-10-CM

## 2018-05-09 DIAGNOSIS — Z951 Presence of aortocoronary bypass graft: Secondary | ICD-10-CM

## 2018-05-09 DIAGNOSIS — Z9181 History of falling: Secondary | ICD-10-CM

## 2018-05-09 DIAGNOSIS — I484 Atypical atrial flutter: Secondary | ICD-10-CM | POA: Diagnosis not present

## 2018-05-09 NOTE — Progress Notes (Signed)
Virtual Visit via Video Note   This visit type was conducted due to national recommendations for restrictions regarding the COVID-19 Pandemic (e.g. social distancing) in an effort to limit this patient's exposure and mitigate transmission in our community.  Due to his co-morbid illnesses, this patient is at least at moderate risk for complications without adequate follow up.  This format is felt to be most appropriate for this patient at this time.  All issues noted in this document were discussed and addressed.  A limited physical exam was performed with this format.  Please refer to the patient's chart for his consent to telehealth for [facility].  Evaluation Performed:  Follow-up visit  This visit type was conducted due to national recommendations for restrictions regarding the COVID-19 Pandemic (e.g. social distancing).  This format is felt to be most appropriate for this patient at this time.  All issues noted in this document were discussed and addressed.  No physical exam was performed (except for noted visual exam findings with Video Visits).  Please refer to the patient's chart (MyChart message for video visits and phone note for telephone visits) for the patient's consent to telehealth for [facility].  Date:  05/09/2018  ID: Lawrence Morton, DOB [birth year], MRN [id number]   Patient Location: [address] [place] [address]   Provider location:   [facility] [place] [facility]  PCP:  Hal Morales, NP  Cardiologist:  Gypsy Balsam, MD     Chief Complaint: I passed out last week and  History of Present Illness:    Lawrence Morton is a 72 y.o. male  who presents via audio/video conferencing for a telehealth visit today.  With atrial flutter with relatively slow ventricular response, coronary artery disease, hypertension, neuropathy of unclear origin last week and he was talking to his family he was sitting on the back of his trunk of a car.  He does have a CVA and then  according to the family he stopped responding he did not fall time he maintained posture.  He was not responding for few minutes eventually 911 was called first responders came in at that time he started coming to.  Then eventually did counsel [id number] he did not go to the hospital rest of the day he was weak tired and exhausted and slept a lot but was able to make sense.  He remember talking to his family not feeling well He knows that they bring him to the back of the car and that he is driving home.   The patient does not have symptoms concerning for COVID-19 infection (fever, chills, cough, or new SHORTNESS OF BREATH).    Prior CV studies:   The following studies were reviewed today:       No past medical history on file.  Past Surgical History:  Procedure Laterality Date   GALLBLADDER SURGERY     KNEE RECONSTRUCTION       Current Meds  Medication Sig   aspirin EC 325 MG tablet Take 325 mg by mouth daily.    buPROPion (WELLBUTRIN SR) 150 MG 12 hr tablet Take 1 tablet by mouth 2 (two) times daily.   clopidogrel (PLAVIX) 75 MG tablet Take 1 tablet by mouth daily.   Coenzyme Q-10 200 MG CAPS Take 1 tablet by mouth daily.   DOCOSAHEXAENOIC ACID PO Take 1 tablet by mouth daily.   finasteride (PROSCAR) 5 MG tablet Take 1 tablet by mouth daily.   gabapentin (NEURONTIN) 300 MG  capsule Take 600 mg by mouth 2 (two) times daily.    isosorbide mononitrate (IMDUR) 60 MG 24 hr tablet Take 2 tablets by mouth daily.    levothyroxine (SYNTHROID, LEVOTHROID) 75 MCG tablet Take 1 tablet by mouth daily.    lisinopril (PRINIVIL,ZESTRIL) 5 MG tablet Take 5 mg by mouth daily.    montelukast (SINGULAIR) 10 MG tablet Take 1 tablet by mouth daily.   Multiple Vitamin (MULTIVITAMIN) capsule Take 1 capsule by mouth daily.   nitroGLYCERIN (NITROSTAT) 0.4 MG SL tablet Place 1 tablet under the tongue as needed for chest pain.   oxybutynin (DITROPAN-XL) 5 MG 24 hr tablet Take 1 tablet by mouth  daily.   Potassium Carbonate GRAN by Does not apply route.   ranolazine (RANEXA) 1000 MG SR tablet Take 1 tablet by mouth daily.   simvastatin (ZOCOR) 20 MG tablet Take 1 tablet by mouth daily.   temazepam (RESTORIL) 15 MG capsule Take 1 capsule by mouth daily.      Family History: The patient's family history includes Depression in his sister; Hypertension in his father, mother, and sister.   ROS:   Please see the history of present illness.     All other systems reviewed and are negative.   Labs/Other Tests and Data Reviewed:     Recent Labs: No results found for requested labs within last 8760 hours.  Recent Lipid Panel No results found for: CHOL, TRIG, HDL, CHOLHDL, VLDL, LDLCALC, LDLDIRECT    Exam:    Vital Signs:  BP (!) 118/53    Pulse (!) 59    Wt 249 lb (112.9 kg)    BMI 35.73 kg/m     Wt Readings from Last 3 Encounters:  05/09/18 249 lb (112.9 kg)  11/08/17 262 lb (118.8 kg)  09/05/17 268 lb 12.8 oz (121.9 kg)     Well nourished, well developed in no acute distress. Alert oriented x3 not in any distress I talked to him over the phone there were not able to establish video link.  His wife participate and make some comments during the discussion.  We do three-way call.  Unremarkable interview he is fine.  Diagnosis for this visit:   1. Atypical atrial flutter (HCC)   2. Coronary artery disease involving native coronary artery of native heart without angina pectoris   3. Polyneuropathy associated with underlying disease (HCC)   4. Hx of CABG   5. Risk for falls      ASSESSMENT & PLAN:    1.  Atypical atrial flutter not anticoagulated because of frequent falls secondary to neuropathy discussion about watchman device has been already initiated but no conclusive decision has been made yet.  He did have EKG done in the fall last year which was relatively slow ventricle response more that maybe his event was related to bradycardia.  He did not completely  passed out but significant bradycardia can make him feel poorly the way he did, therefore I bring him to the office to have EKG, will put monitor on him for a week or 2 and see if there is any significant bradycardia.  I told him that he should not drive.  His wife told me that he does not drive anymore anyway. 2.  Coronary artery disease asymptomatic denies having any chest pain tightness squeezing pressure burning chest. 3.  Polyneuropathy So far unclear origin. 4.  History of CABG.  Noted. 5.  Risk of falls.  Noted not anticoagulated because of that  COVID-19 Education:  The signs and symptoms of COVID-19 were discussed with the patient and how to seek care for testing (follow up with PCP or arrange E-visit).  The importance of social distancing was discussed today.  Patient Risk:   After full review of this patients clinical status, I feel that they are at least moderate risk at this time.  Time:   Today, I have spent 20 minutes with the patient with telehealth technology discussing pt health issues.  I spent 5 minutes reviewing her chart before the visit.  Visit was finished at 1:08 PM.    Medication Adjustments/Labs and Tests Ordered: Current medicines are reviewed at length with the patient today.  Concerns regarding medicines are outlined above.  Orders Placed This Encounter  Procedures   LONG TERM MONITOR-LIVE TELEMETRY (3-14 DAYS)   EKG 12-Lead   Medication changes: No orders of the defined types were placed in this encounter.    Disposition: 14 days 0 patch, will bring him to the office EKG.  I will see him back in about 3 weeks.  Signed, Georgeanna Leaobert J. Malini Flemings, MD, [facility] 05/09/2018 1:10 PM    [facility]

## 2018-05-09 NOTE — Patient Instructions (Signed)
Medication Instructions:  Your physician recommends that you continue on your current medications as directed. Please refer to the Current Medication list given to you today.  If you need a refill on your cardiac medications before your next appointment, please call your pharmacy.   Lab work: None If you have labs (blood work) drawn today and your tests are completely normal, you will receive your results only by: Marland Kitchen MyChart Message (if you have MyChart) OR . A paper copy in the mail If you have any lab test that is abnormal or we need to change your treatment, we will call you to review the results.  Testing/Procedures: Your physician has recommended that you wear a holter monitor. Holter monitors are medical devices that record the heart's electrical activity. Doctors most often use these monitors to diagnose arrhythmias. Arrhythmias are problems with the speed or rhythm of the heartbeat. The monitor is a small, portable device. You can wear one while you do your normal daily activities. This is usually used to diagnose what is causing palpitations/syncope (passing out).    Follow-Up: At [facility], you and your health needs are our priority.  As part of our continuing mission to provide you with exceptional heart care, we have created designated Provider Care Teams.  These Care Teams include your primary Cardiologist (physician) and Advanced Practice Providers (APPs -  Physician Assistants and Nurse Practitioners) who all work together to provide you with the care you need, when you need it. You will need a follow up appointment in 3 weeks.   Any Other Special Instructions Will Be Listed Below (If Applicable).

## 2018-05-10 ENCOUNTER — Ambulatory Visit (INDEPENDENT_AMBULATORY_CARE_PROVIDER_SITE_OTHER): Payer: Medicare Other | Admitting: Cardiology

## 2018-05-10 VITALS — BP 120/62 | HR 55 | Ht 70.0 in | Wt 253.0 lb

## 2018-05-10 DIAGNOSIS — Z951 Presence of aortocoronary bypass graft: Secondary | ICD-10-CM

## 2018-05-10 DIAGNOSIS — G4733 Obstructive sleep apnea (adult) (pediatric): Secondary | ICD-10-CM | POA: Diagnosis not present

## 2018-05-10 DIAGNOSIS — I2510 Atherosclerotic heart disease of native coronary artery without angina pectoris: Secondary | ICD-10-CM

## 2018-05-10 DIAGNOSIS — Z9181 History of falling: Secondary | ICD-10-CM

## 2018-05-10 DIAGNOSIS — Z9989 Dependence on other enabling machines and devices: Secondary | ICD-10-CM

## 2018-05-10 DIAGNOSIS — I484 Atypical atrial flutter: Secondary | ICD-10-CM | POA: Diagnosis not present

## 2018-05-10 NOTE — Progress Notes (Signed)
Cardiology Office Note:    Date:  05/10/2018   ID:  Lawrence Morton, DOB [birth year], MRN [id number]  PCP:  Hal MoralesGunter, Tara G, NP  Cardiologist:  Gypsy Balsamobert , MD    Referring MD: Hal MoralesGunter, Tara G, NP   No chief complaint on file. Doing well did not have any episodes since yesterday  History of Present Illness:    Lawrence Morton is a 73 y.o. male I did have a tele-visit with him yesterday and the reason for that was episodes of what appears to be near syncope.  It did not completely passed out and he confirmed up today he was simply sitting at the back of he is open trunk in the car and then became unconscious meaning people were not able to communicate with him however he did not lose muscle tone.  Concern is the fact that he does have atrial flutter with slow ventricular rate.  So I brought him today to our office to do EKG and EKG confirmed presence of a 12 flutter with variable AV block with ventricular rate of 55.  There is no acute ST segment changes.  No past medical history on file.  Past Surgical History:  Procedure Laterality Date  . GALLBLADDER SURGERY    . KNEE RECONSTRUCTION      Current Medications: Current Meds  Medication Sig  . aspirin EC 325 MG tablet Take 325 mg by mouth daily.   Marland Kitchen. buPROPion (WELLBUTRIN SR) 150 MG 12 hr tablet Take 1 tablet by mouth 2 (two) times daily.  . clopidogrel (PLAVIX) 75 MG tablet Take 1 tablet by mouth daily.  . Coenzyme Q-10 200 MG CAPS Take 1 tablet by mouth daily.  . DOCOSAHEXAENOIC ACID PO Take 1 tablet by mouth daily.  . finasteride (PROSCAR) 5 MG tablet Take 1 tablet by mouth daily.  Marland Kitchen. gabapentin (NEURONTIN) 300 MG capsule Take 600 mg by mouth 2 (two) times daily.   . isosorbide mononitrate (IMDUR) 60 MG 24 hr tablet Take 2 tablets by mouth daily.   Marland Kitchen. levothyroxine (SYNTHROID, LEVOTHROID) 75 MCG tablet Take 1 tablet by mouth daily.   Marland Kitchen. lisinopril (PRINIVIL,ZESTRIL) 5 MG tablet Take 5 mg by mouth daily.   . montelukast (SINGULAIR) 10  MG tablet Take 1 tablet by mouth daily.  . Multiple Vitamin (MULTIVITAMIN) capsule Take 1 capsule by mouth daily.  . nitroGLYCERIN (NITROSTAT) 0.4 MG SL tablet Place 1 tablet under the tongue as needed for chest pain.  Marland Kitchen. oxybutynin (DITROPAN-XL) 5 MG 24 hr tablet Take 1 tablet by mouth daily.  . Potassium Carbonate GRAN by Does not apply route.  . ranolazine (RANEXA) 1000 MG SR tablet Take 1 tablet by mouth daily.  . simvastatin (ZOCOR) 20 MG tablet Take 1 tablet by mouth daily.  . temazepam (RESTORIL) 15 MG capsule Take 1 capsule by mouth daily.     Allergies:   Patient has no known allergies.   Social History   Socioeconomic History  . Marital status: Married    Spouse name: Not on file  . Number of children: Not on file  . Years of education: Not on file  . Highest education level: Not on file  Occupational History  . Not on file  Social Needs  . Financial resource strain: Not on file  . Food insecurity:    Worry: Not on file    Inability: Not on file  . Transportation needs:    Medical: Not on file    Non-medical: Not on file  Tobacco Use  . Smoking status: Never Smoker  . Smokeless tobacco: Never Used  Substance and Sexual Activity  . Alcohol use: Not on file  . Drug use: Not on file  . Sexual activity: Not on file  Lifestyle  . Physical activity:    Days per week: Not on file    Minutes per session: Not on file  . Stress: Not on file  Relationships  . Social connections:    Talks on phone: Not on file    Gets together: Not on file    Attends religious service: Not on file    Active member of club or organization: Not on file    Attends meetings of clubs or organizations: Not on file    Relationship status: Not on file  Other Topics Concern  . Not on file  Social History Narrative  . Not on file     Family History: The patient's family history includes Depression in his sister; Hypertension in his father, mother, and sister. ROS:   Please see the  history of present illness.    All 14 point review of systems negative except as described per history of present illness  EKGs/Labs/Other Studies Reviewed:      Recent Labs: No results found for requested labs within last 8760 hours.  Recent Lipid Panel No results found for: CHOL, TRIG, HDL, CHOLHDL, VLDL, LDLCALC, LDLDIRECT  Physical Exam:    VS:  BP 120/62   Pulse (!) 55   Ht 5\' 10"  (1.778 m)   Wt 253 lb (114.8 kg)   BMI 36.30 kg/m     Wt Readings from Last 3 Encounters:  05/10/18 253 lb (114.8 kg)  05/09/18 249 lb (112.9 kg)  11/08/17 262 lb (118.8 kg)     GEN:  Well nourished, well developed in no acute distress HEENT: Normal NECK: No JVD; No carotid bruits LYMPHATICS: No lymphadenopathy CARDIAC: RRR, no murmurs, no rubs, no gallops RESPIRATORY:  Clear to auscultation without rales, wheezing or rhonchi  ABDOMEN: Soft, non-tender, non-distended MUSCULOSKELETAL:  No edema; No deformity  SKIN: Warm and dry LOWER EXTREMITIES: no swelling NEUROLOGIC:  Alert and oriented x 3 PSYCHIATRIC:  Normal affect   ASSESSMENT:    1. Coronary artery disease involving native coronary artery of native heart without angina pectoris   2. Atypical atrial flutter (HCC)   3. OSA on CPAP   4. Risk for falls   5. Hx of CABG    PLAN:    In order of problems listed above:  1. Coronary disease stable without any chest pain. 2. 2.  Atrial flutter.  Will put monitor on to see if he got any significant bradycardia.  He is not on any AV blocking agent. 3. Risk of fall.  Not anticoagulated because of this.  Progression discussed 4. History of CABG.  Noted.   Medication Adjustments/Labs and Tests Ordered: Current medicines are reviewed at length with the patient today.  Concerns regarding medicines are outlined above.  No orders of the defined types were placed in this encounter.  Medication changes: No orders of the defined types were placed in this encounter.   Signed, Georgeanna Lea, MD, [facility] 05/10/2018 11:17 AM    [facility]

## 2018-05-11 ENCOUNTER — Ambulatory Visit: Payer: Medicare Other | Admitting: Cardiology

## 2018-05-15 ENCOUNTER — Other Ambulatory Visit: Payer: Medicare Other

## 2018-05-26 ENCOUNTER — Telehealth: Payer: Medicare Other | Admitting: Cardiology

## 2018-06-01 ENCOUNTER — Telehealth (INDEPENDENT_AMBULATORY_CARE_PROVIDER_SITE_OTHER): Payer: Medicare Other | Admitting: Cardiology

## 2018-06-01 ENCOUNTER — Other Ambulatory Visit: Payer: Self-pay

## 2018-06-01 ENCOUNTER — Encounter: Payer: Self-pay | Admitting: Cardiology

## 2018-06-01 VITALS — BP 100/41 | Wt 244.6 lb

## 2018-06-01 DIAGNOSIS — I2510 Atherosclerotic heart disease of native coronary artery without angina pectoris: Secondary | ICD-10-CM

## 2018-06-01 DIAGNOSIS — Z951 Presence of aortocoronary bypass graft: Secondary | ICD-10-CM

## 2018-06-01 DIAGNOSIS — Z9989 Dependence on other enabling machines and devices: Secondary | ICD-10-CM

## 2018-06-01 DIAGNOSIS — G4733 Obstructive sleep apnea (adult) (pediatric): Secondary | ICD-10-CM

## 2018-06-01 MED ORDER — ISOSORBIDE MONONITRATE ER 60 MG PO TB24: 30.0000 mg | ORAL_TABLET | Freq: Every day | ORAL | 0 refills | Status: DC

## 2018-06-01 NOTE — Patient Instructions (Signed)
Medication Instructions:  Your physician has recommended you make the following change in your medication:   Decrease: Imdur to 30 mg daily   If you need a refill on your cardiac medications before your next appointment, please call your pharmacy.   Lab work: None.  If you have labs (blood work) drawn today and your tests are completely normal, you will receive your results only by: Marland Kitchen MyChart Message (if you have MyChart) OR . A paper copy in the mail If you have any lab test that is abnormal or we need to change your treatment, we will call you to review the results.  Testing/Procedures: None.   Follow-Up: At [facility], you and your health needs are our priority.  As part of our continuing mission to provide you with exceptional heart care, we have created designated Provider Care Teams.  These Care Teams include your primary Cardiologist (physician) and Advanced Practice Providers (APPs -  Physician Assistants and Nurse Practitioners) who all work together to provide you with the care you need, when you need it. You will need a follow up appointment in 1 months.  Please call our office 2 months in advance to schedule this appointment.  You may see No primary care provider on file. or another member of our [employer] Provider Team in [place]: Norman Herrlich, MD . Belva Crome, MD  Any Other Special Instructions Will Be Listed Below (If Applicable).

## 2018-06-02 NOTE — Progress Notes (Signed)
Virtual Visit via Telephone Note   This visit type was conducted due to national recommendations for restrictions regarding the COVID-19 Pandemic (e.g. social distancing) in an effort to limit this patient's exposure and mitigate transmission in our community.  Due to his co-morbid illnesses, this patient is at least at moderate risk for complications without adequate follow up.  This format is felt to be most appropriate for this patient at this time.  The patient did not have access to video technology/had technical difficulties with video requiring transitioning to audio format only (telephone).  All issues noted in this document were discussed and addressed.  No physical exam could be performed with this format.  Please refer to the patient's chart for his  consent to telehealth for [facility].  Evaluation Performed:  Follow-up visit  This visit type was conducted due to national recommendations for restrictions regarding the COVID-19 Pandemic (e.g. social distancing).  This format is felt to be most appropriate for this patient at this time.  All issues noted in this document were discussed and addressed.  No physical exam was performed (except for noted visual exam findings with Video Visits).  Please refer to the patient's chart (MyChart message for video visits and phone note for telephone visits) for the patient's consent to telehealth for [facility].  Date:  06/02/2018  ID: CAELEN REIERSON, DOB Mar 26, 1945, MRN [id number]   Patient Location: [address] [place] [address]   Provider location:   [facility] [place] [facility]  PCP:  Hal Morales, NP  Cardiologist:  Gypsy Balsam, MD     Chief Complaint: Doing well but got low blood pressure  History of Present Illness:    Lawrence Morton is a 73 y.o. male  who presents via audio/video conferencing for a telehealth visit today.  With coronary artery disease, dyslipidemia, atrial flutter.  He does have a tele-visit with me  today to talk about his problems overall he is doing well but described episode of her blood pressure being low in the neighborhood of 100 systolic, when he got blood pressure like this he feels very weak and tired.  We reviewed his medication again do some discrepancy according to my record he supposed to be taking isosorbide mononitrate 120 mg daily apparently he takes only 60.  I asked him to cut it in half take only 30 mg daily.  The goal is to improve his blood pressure hopefully he will feel better.  Denies having any chest pain tightness squeezing pressure burning any chest   The patient does not have symptoms concerning for COVID-19 infection (fever, chills, cough, or new SHORTNESS OF BREATH).    Prior CV studies:   The following studies were reviewed today:       No past medical history on file.  Past Surgical History:  Procedure Laterality Date  . GALLBLADDER SURGERY    . KNEE RECONSTRUCTION       Current Meds  Medication Sig  . aspirin EC 325 MG tablet Take 325 mg by mouth daily.   Marland Kitchen buPROPion (WELLBUTRIN SR) 150 MG 12 hr tablet Take 1 tablet by mouth 2 (two) times daily.  . clopidogrel (PLAVIX) 75 MG tablet Take 1 tablet by mouth daily.  . Coenzyme Q-10 200 MG CAPS Take 1 tablet by mouth daily.  . DOCOSAHEXAENOIC ACID PO Take 1 tablet by mouth daily.  . finasteride (PROSCAR) 5 MG tablet Take 1 tablet by mouth daily.  Marland Kitchen gabapentin (NEURONTIN) 300  MG capsule Take 600 mg by mouth 2 (two) times daily.   . isosorbide mononitrate (IMDUR) 60 MG 24 hr tablet Take 0.5 tablets (30 mg total) by mouth daily.  Marland Kitchen. levothyroxine (SYNTHROID, LEVOTHROID) 75 MCG tablet Take 1 tablet by mouth daily.   Marland Kitchen. lisinopril (PRINIVIL,ZESTRIL) 5 MG tablet Take 5 mg by mouth daily.   . montelukast (SINGULAIR) 10 MG tablet Take 1 tablet by mouth daily.  . Multiple Vitamin (MULTIVITAMIN) capsule Take 1 capsule by mouth daily.  . nitroGLYCERIN (NITROSTAT) 0.4 MG SL tablet Place 1 tablet under the  tongue as needed for chest pain.  Marland Kitchen. oxybutynin (DITROPAN-XL) 5 MG 24 hr tablet Take 1 tablet by mouth daily.  . Potassium Carbonate GRAN by Does not apply route.  . ranolazine (RANEXA) 1000 MG SR tablet Take 1 tablet by mouth daily.  . simvastatin (ZOCOR) 20 MG tablet Take 1 tablet by mouth daily.  . temazepam (RESTORIL) 15 MG capsule Take 1 capsule by mouth daily.  . [DISCONTINUED] isosorbide mononitrate (IMDUR) 60 MG 24 hr tablet Take 2 tablets by mouth daily.       Family History: The patient's family history includes Depression in his sister; Hypertension in his father, mother, and sister.   ROS:   Please see the history of present illness.     All other systems reviewed and are negative.   Labs/Other Tests and Data Reviewed:     Recent Labs: No results found for requested labs within last 8760 hours.  Recent Lipid Panel No results found for: CHOL, TRIG, HDL, CHOLHDL, VLDL, LDLCALC, LDLDIRECT    Exam:    Vital Signs:  BP (!) 100/41   Wt 244 lb 9.6 oz (110.9 kg)   BMI 35.10 kg/m     Wt Readings from Last 3 Encounters:  06/01/18 244 lb 9.6 oz (110.9 kg)  05/10/18 253 lb (114.8 kg)  05/09/18 249 lb (112.9 kg)     Well nourished, well developed in no acute distress. Alert awake oriented x3 not in any distress of the mouth my interview.  Diagnosis for this visit:   1. Coronary artery disease involving native coronary artery of native heart without angina pectoris   2. OSA on CPAP   3. Hx of CABG      ASSESSMENT & PLAN:    1.  Coronary disease stable from that point he denies having a chest pain. 2.  Hypotension.  I will cut down his Imdur. 3.  Obstructive sleep apnea using CPAP mask that being followed by primary care physicians. 4.  Status post coronary artery bypass graft.  Doing well from that point review  COVID-19 Education: The signs and symptoms of COVID-19 were discussed with the patient and how to seek care for testing (follow up with PCP or  arrange E-visit).  The importance of social distancing was discussed today.  Patient Risk:   After full review of this patients clinical status, I feel that they are at least moderate risk at this time.  Time:   Today, I have spent 17 minutes with the patient with telehealth technology discussing pt health issues.  I spent 5 minutes reviewing her chart before the visit.  Visit was finished at 4:15 PM.    Medication Adjustments/Labs and Tests Ordered: Current medicines are reviewed at length with the patient today.  Concerns regarding medicines are outlined above.  No orders of the defined types were placed in this encounter.  Medication changes:  Meds ordered this encounter  Medications  .  isosorbide mononitrate (IMDUR) 60 MG 24 hr tablet    Sig: Take 0.5 tablets (30 mg total) by mouth daily.    Dispense:  60 tablet    Refill:  0     Disposition: Follow-up in 3 months  Signed, Georgeanna Lea, MD, [facility] 06/02/2018 9:22 AM    [facility]

## 2018-06-06 ENCOUNTER — Other Ambulatory Visit: Payer: Self-pay | Admitting: *Deleted

## 2018-06-06 DIAGNOSIS — I484 Atypical atrial flutter: Secondary | ICD-10-CM

## 2018-06-20 ENCOUNTER — Encounter: Payer: Self-pay | Admitting: Cardiology

## 2018-06-21 ENCOUNTER — Telehealth: Payer: Self-pay

## 2018-06-21 NOTE — Telephone Encounter (Signed)
Called patient to verify need for plavix per Dr. Docia Furl request, information relayed back for advisement.

## 2018-06-26 ENCOUNTER — Telehealth: Payer: Self-pay | Admitting: Cardiology

## 2018-06-26 NOTE — Telephone Encounter (Signed)
I faxed a letter Lucinda Dell to the surgeons office regaring his Plavix  and received confirmation at  June 15 02:18pm ?[facility]

## 2018-06-28 ENCOUNTER — Other Ambulatory Visit: Payer: Self-pay

## 2018-06-28 ENCOUNTER — Encounter: Payer: Self-pay | Admitting: Cardiology

## 2018-06-28 ENCOUNTER — Telehealth (INDEPENDENT_AMBULATORY_CARE_PROVIDER_SITE_OTHER): Payer: Medicare Other | Admitting: Cardiology

## 2018-06-28 VITALS — BP 188/57 | HR 68 | Wt 244.8 lb

## 2018-06-28 DIAGNOSIS — I483 Typical atrial flutter: Secondary | ICD-10-CM | POA: Diagnosis not present

## 2018-06-28 DIAGNOSIS — Z951 Presence of aortocoronary bypass graft: Secondary | ICD-10-CM

## 2018-06-28 DIAGNOSIS — G63 Polyneuropathy in diseases classified elsewhere: Secondary | ICD-10-CM

## 2018-06-28 DIAGNOSIS — I2510 Atherosclerotic heart disease of native coronary artery without angina pectoris: Secondary | ICD-10-CM

## 2018-06-28 DIAGNOSIS — Z9181 History of falling: Secondary | ICD-10-CM

## 2018-06-28 NOTE — Patient Instructions (Signed)
Medication Instructions:  Your physician recommends that you continue on your current medications as directed. Please refer to the Current Medication list given to you today.  If you need a refill on your cardiac medications before your next appointment, please call your pharmacy.   Lab work: None.  If you have labs (blood work) drawn today and your tests are completely normal, you will receive your results only by: . MyChart Message (if you have MyChart) OR . A paper copy in the mail If you have any lab test that is abnormal or we need to change your treatment, we will call you to review the results.  Testing/Procedures: None.   Follow-Up: At [facility], you and your health needs are our priority.  As part of our continuing mission to provide you with exceptional heart care, we have created designated Provider Care Teams.  These Care Teams include your primary Cardiologist (physician) and Advanced Practice Providers (APPs -  Physician Assistants and Nurse Practitioners) who all work together to provide you with the care you need, when you need it. You will need a follow up appointment in 3 months.  Please call our office 2 months in advance to schedule this appointment.  You may see No primary care provider on file. or another member of our [facility] Provider Team in [place]: Brian Munley, MD . Rajan Revankar, MD  Any Other Special Instructions Will Be Listed Below (If Applicable).     

## 2018-06-28 NOTE — Progress Notes (Signed)
Virtual Visit via Telephone Note   This visit type was conducted due to national recommendations for restrictions regarding the COVID-19 Pandemic (e.g. social distancing) in an effort to limit this patient's exposure and mitigate transmission in our community.  Due to his co-morbid illnesses, this patient is at least at moderate risk for complications without adequate follow up.  This format is felt to be most appropriate for this patient at this time.  The patient did not have access to video technology/had technical difficulties with video requiring transitioning to audio format only (telephone).  All issues noted in this document were discussed and addressed.  No physical exam could be performed with this format.  Please refer to the patient's chart for his  consent to telehealth for [facility].  Evaluation Performed:  Follow-up visit  This visit type was conducted due to national recommendations for restrictions regarding the COVID-19 Pandemic (e.g. social distancing).  This format is felt to be most appropriate for this patient at this time.  All issues noted in this document were discussed and addressed.  No physical exam was performed (except for noted visual exam findings with Video Visits).  Please refer to the patient's chart (MyChart message for video visits and phone note for telephone visits) for the patient's consent to telehealth for [facility].  Date:  06/28/2018  ID: Lawrence Morton, DOB Dec 19, 1945, MRN [id number]   Patient Location: [address] [place] [address]   Provider location:   [place] Office  PCP:  Charlynn Court, NP  Cardiologist:  Jenne Campus, MD     Chief Complaint: I am doing well  History of Present Illness:    Lawrence Morton is a 73 y.o. male  who presents via audio/video conferencing for a telehealth visit today.  Complex past medical history which include coronary artery disease, status post coronary artery bypass graft,  permanent atrial flutter.  This is ongoing discussion with him about risk of stroke and need for anticoagulation.  He cannot be anticoagulated because of very unsteady gait and frequent falls.  We discussed the issue of watchman device and today's tell me clearly he does not want to have it.  I explained to him one more time the rationale for this and importance of preventing from having stroke but he simply does not want to have it he said he does not have good feeling about this and he wants to think a little bit more about that.  I told him if he make a decision he needs to let me know.  Otherwise he is living fairly sedentary lifestyle.  He does not do much he said he try to clean some stuff in his room but had difficulty doing this because of poor balance. No chest pain tightness squeezing pressure burning chest. No palpitations and dizziness.   The patient does not have symptoms concerning for COVID-19 infection (fever, chills, cough, or new SHORTNESS OF BREATH).    Prior CV studies:   The following studies were reviewed today:  Long-term Holter monitor done in Jun 06, 2018 showed atrial flutter with controlled ventricular rate minimum 34 average 61 maximum 139.     No past medical history on file.  Past Surgical History:  Procedure Laterality Date  . GALLBLADDER SURGERY    . KNEE RECONSTRUCTION       Current Meds  Medication Sig  . buPROPion (WELLBUTRIN SR) 150 MG 12 hr tablet Take 1 tablet by mouth 2 (two)  times daily.  . Coenzyme Q-10 200 MG CAPS Take 1 tablet by mouth daily.  . DOCOSAHEXAENOIC ACID PO Take 1 tablet by mouth daily.  . finasteride (PROSCAR) 5 MG tablet Take 1 tablet by mouth daily.  Marland Kitchen. gabapentin (NEURONTIN) 300 MG capsule Take 600 mg by mouth 2 (two) times daily.   . isosorbide mononitrate (IMDUR) 60 MG 24 hr tablet Take 0.5 tablets (30 mg total) by mouth daily.  Marland Kitchen. levothyroxine (SYNTHROID, LEVOTHROID) 75 MCG tablet Take 1 tablet by mouth daily.   Marland Kitchen.  lisinopril (PRINIVIL,ZESTRIL) 5 MG tablet Take 5 mg by mouth daily.   . montelukast (SINGULAIR) 10 MG tablet Take 1 tablet by mouth daily.  . Multiple Vitamin (MULTIVITAMIN) capsule Take 1 capsule by mouth daily.  . nitroGLYCERIN (NITROSTAT) 0.4 MG SL tablet Place 1 tablet under the tongue as needed for chest pain.  Marland Kitchen. oxybutynin (DITROPAN-XL) 5 MG 24 hr tablet Take 1 tablet by mouth daily.  . Potassium Carbonate GRAN by Does not apply route.  . ranolazine (RANEXA) 1000 MG SR tablet Take 1 tablet by mouth daily.  . simvastatin (ZOCOR) 20 MG tablet Take 1 tablet by mouth daily.  . temazepam (RESTORIL) 15 MG capsule Take 1 capsule by mouth daily.      Family History: The patient's family history includes Depression in his sister; Hypertension in his father, mother, and sister.   ROS:   Please see the history of present illness.     All other systems reviewed and are negative.   Labs/Other Tests and Data Reviewed:     Recent Labs: No results found for requested labs within last 8760 hours.  Recent Lipid Panel No results found for: CHOL, TRIG, HDL, CHOLHDL, VLDL, LDLCALC, LDLDIRECT    Exam:    Vital Signs:  BP (!) 188/57   Pulse 68   Wt 244 lb 12.8 oz (111 kg)   BMI 35.13 kg/m     Wt Readings from Last 3 Encounters:  06/28/18 244 lb 12.8 oz (111 kg)  06/01/18 244 lb 9.6 oz (110.9 kg)  05/10/18 253 lb (114.8 kg)     Well nourished, well developed in no acute distress. Alert awake and x3 talking to me over the phone.  Not in any distress denies have any symptoms at the time of my interview  Diagnosis for this visit:   1. Typical atrial flutter (HCC)   2. Coronary artery disease involving native coronary artery of native heart without angina pectoris   3. Polyneuropathy associated with underlying disease (HCC)   4. Hx of CABG   5. Risk for falls      ASSESSMENT & PLAN:    1.  Atrial flutter rate control unable to anticoagulate because of frequent falls.  Refuses  watchman device.  Will continue discussion about that.  He chads 2 vascular equals 4 2.  Coronary artery disease stable without any symptoms we will continue present conservative approach 3.  Polyneuropathy stable followed by neurology 4.  Risk of falls, not anticoagulated because of that 5.  Dyslipidemia continue with statin  COVID-19 Education: The signs and symptoms of COVID-19 were discussed with the patient and how to seek care for testing (follow up with PCP or arrange E-visit).  The importance of social distancing was discussed today.  Patient Risk:   After full review of this patients clinical status, I feel that they are at least moderate risk at this time.  Time:   Today, I have spent 15 minutes with  the patient with telehealth technology discussing pt health issues.  I spent 5 minutes reviewing her chart before the visit.  Visit was finished at 2:56 PM.    Medication Adjustments/Labs and Tests Ordered: Current medicines are reviewed at length with the patient today.  Concerns regarding medicines are outlined above.  No orders of the defined types were placed in this encounter.  Medication changes: No orders of the defined types were placed in this encounter.    Disposition: Follow-up in 3 months  Signed, Georgeanna Leaobert J. [place]a Hedman, MD, [facility] 06/28/2018 2:58 PM    [facility]

## 2018-08-15 ENCOUNTER — Other Ambulatory Visit: Payer: Self-pay | Admitting: Cardiology

## 2018-10-02 ENCOUNTER — Other Ambulatory Visit: Payer: Self-pay

## 2018-10-02 ENCOUNTER — Ambulatory Visit: Payer: Medicare Other | Admitting: Cardiology

## 2018-10-02 VITALS — BP 142/72 | HR 88 | Ht 70.0 in | Wt 259.0 lb

## 2018-10-02 DIAGNOSIS — I2510 Atherosclerotic heart disease of native coronary artery without angina pectoris: Secondary | ICD-10-CM | POA: Diagnosis not present

## 2018-10-02 DIAGNOSIS — Z951 Presence of aortocoronary bypass graft: Secondary | ICD-10-CM | POA: Diagnosis not present

## 2018-10-02 DIAGNOSIS — R0609 Other forms of dyspnea: Secondary | ICD-10-CM

## 2018-10-02 DIAGNOSIS — I483 Typical atrial flutter: Secondary | ICD-10-CM

## 2018-10-02 NOTE — Progress Notes (Signed)
Cardiology Office Note:    Date:  10/02/2018   ID:  Lawrence Morton, DOB February 01, 1945, MRN [id number]  PCP:  Hal Morales, NP  Cardiologist:  Gypsy Balsam, MD    Referring MD: Hal Morales, NP   Chief Complaint  Patient presents with  . Follow-up  Doing fair  History of Present Illness:    Lawrence Morton is a 73 y.o. male with chronic muscle weakness cardiac wise does have history of coronary artery disease, also atrial flutter which is permanent.  Refused anticoagulation refused watchman device.  Overall he is doing fair he said he got difficulty walking around and doing things because of fatigue and tiredness in his legs but at the same time he still trying to be active he described the fact that he is some sure and he was able to cut some trees outside his property.  No past medical history on file.  Past Surgical History:  Procedure Laterality Date  . GALLBLADDER SURGERY    . KNEE RECONSTRUCTION      Current Medications: Current Meds  Medication Sig  . aspirin EC 325 MG tablet Take 325 mg by mouth daily.   Marland Kitchen buPROPion (WELLBUTRIN SR) 150 MG 12 hr tablet Take 1 tablet by mouth 2 (two) times daily.  . clopidogrel (PLAVIX) 75 MG tablet Take 1 tablet by mouth daily.  . Coenzyme Q-10 200 MG CAPS Take 1 tablet by mouth daily.  . DOCOSAHEXAENOIC ACID PO Take 1 tablet by mouth daily.  . finasteride (PROSCAR) 5 MG tablet Take 1 tablet by mouth daily.  Marland Kitchen gabapentin (NEURONTIN) 300 MG capsule Take 600 mg by mouth 2 (two) times daily.   . isosorbide mononitrate (IMDUR) 60 MG 24 hr tablet TAKE 1/2 TABLET BY MOUTH ONCE DAILY.  Marland Kitchen levothyroxine (SYNTHROID, LEVOTHROID) 75 MCG tablet Take 1 tablet by mouth daily.   Marland Kitchen lisinopril (PRINIVIL,ZESTRIL) 5 MG tablet Take 5 mg by mouth daily.   . montelukast (SINGULAIR) 10 MG tablet Take 1 tablet by mouth daily.  . Multiple Vitamin (MULTIVITAMIN) capsule Take 1 capsule by mouth daily.  . nitroGLYCERIN (NITROSTAT) 0.4 MG SL tablet Place 1 tablet  under the tongue as needed for chest pain.  Marland Kitchen oxybutynin (DITROPAN-XL) 5 MG 24 hr tablet Take 1 tablet by mouth daily.  . Potassium Carbonate GRAN by Does not apply route.  . ranolazine (RANEXA) 1000 MG SR tablet Take 1 tablet by mouth daily.  . simvastatin (ZOCOR) 20 MG tablet Take 1 tablet by mouth daily.  . temazepam (RESTORIL) 15 MG capsule Take 1 capsule by mouth daily.     Allergies:   Patient has no known allergies.   Social History   Socioeconomic History  . Marital status: Married    Spouse name: Not on file  . Number of children: Not on file  . Years of education: Not on file  . Highest education level: Not on file  Occupational History  . Not on file  Social Needs  . Financial resource strain: Not on file  . Food insecurity    Worry: Not on file    Inability: Not on file  . Transportation needs    Medical: Not on file    Non-medical: Not on file  Tobacco Use  . Smoking status: Never Smoker  . Smokeless tobacco: Never Used  Substance and Sexual Activity  . Alcohol use: Not on file  . Drug use: Not on file  . Sexual activity: Not on file  Lifestyle  .  Physical activity    Days per week: Not on file    Minutes per session: Not on file  . Stress: Not on file  Relationships  . Social Herbalist on phone: Not on file    Gets together: Not on file    Attends religious service: Not on file    Active member of club or organization: Not on file    Attends meetings of clubs or organizations: Not on file    Relationship status: Not on file  Other Topics Concern  . Not on file  Social History Narrative  . Not on file     Family History: The patient's family history includes Depression in his sister; Hypertension in his father, mother, and sister. ROS:   Please see the history of present illness.    All 14 point review of systems negative except as described per history of present illness  EKGs/Labs/Other Studies Reviewed:      Recent Labs: No  results found for requested labs within last 8760 hours.  Recent Lipid Panel No results found for: CHOL, TRIG, HDL, CHOLHDL, VLDL, LDLCALC, LDLDIRECT  Physical Exam:    VS:  BP (!) 142/72   Pulse 88   Ht 5\' 10"  (1.778 m)   Wt 259 lb (117.5 kg)   SpO2 98%   BMI 37.16 kg/m     Wt Readings from Last 3 Encounters:  10/02/18 259 lb (117.5 kg)  06/28/18 244 lb 12.8 oz (111 kg)  06/01/18 244 lb 9.6 oz (110.9 kg)     GEN:  Well nourished, well developed in no acute distress HEENT: Normal NECK: No JVD; No carotid bruits LYMPHATICS: No lymphadenopathy CARDIAC: RRR, no murmurs, no rubs, no gallops RESPIRATORY:  Clear to auscultation without rales, wheezing or rhonchi  ABDOMEN: Soft, non-tender, non-distended MUSCULOSKELETAL:  No edema; No deformity  SKIN: Warm and dry LOWER EXTREMITIES: no swelling NEUROLOGIC:  Alert and oriented x 3 PSYCHIATRIC:  Normal affect   ASSESSMENT:    1. Coronary artery disease involving native coronary artery of native heart without angina pectoris   2. Typical atrial flutter ([place])   3. Hx of CABG   4. DOE (dyspnea on exertion)    PLAN:    In order of problems listed above:  1. Coronary disease stable denies have any symptoms we will continue present management. 2. Typical atrial flutter rate control refused anticoagulation refused watchman device 3. History of coronary artery disease stable from that point review status post CABG. 4. Dyspnea on exertion is stable.   Medication Adjustments/Labs and Tests Ordered: Current medicines are reviewed at length with the patient today.  Concerns regarding medicines are outlined above.  No orders of the defined types were placed in this encounter.  Medication changes: No orders of the defined types were placed in this encounter.   Signed, Park Liter, MD, [facility] 10/02/2018 3:12 PM    [place]

## 2018-10-02 NOTE — Patient Instructions (Signed)
Medication Instructions:  Your physician recommends that you continue on your current medications as directed. Please refer to the Current Medication list given to you today.  If you need a refill on your cardiac medications before your next appointment, please call your pharmacy.   Lab work: None.   If you have labs (blood work) drawn today and your tests are completely normal, you will receive your results only by: . MyChart Message (if you have MyChart) OR . A paper copy in the mail If you have any lab test that is abnormal or we need to change your treatment, we will call you to review the results.  Testing/Procedures: None.   Follow-Up: At [facility], you and your health needs are our priority.  As part of our continuing mission to provide you with exceptional heart care, we have created designated Provider Care Teams.  These Care Teams include your primary Cardiologist (physician) and Advanced Practice Providers (APPs -  Physician Assistants and Nurse Practitioners) who all work together to provide you with the care you need, when you need it. You will need a follow up appointment in 6 months.  Please call our office 2 months in advance to schedule this appointment.  You may see No primary care provider on file. or another member of our [facility] Provider Team in [place]: Brian Munley, MD . Rajan Revankar, MD  Any Other Special Instructions Will Be Listed Below (If Applicable).    

## 2018-12-02 DIAGNOSIS — I10 Essential (primary) hypertension: Secondary | ICD-10-CM | POA: Diagnosis not present

## 2018-12-02 DIAGNOSIS — R262 Difficulty in walking, not elsewhere classified: Secondary | ICD-10-CM | POA: Diagnosis not present

## 2018-12-02 DIAGNOSIS — I4892 Unspecified atrial flutter: Secondary | ICD-10-CM | POA: Diagnosis not present

## 2018-12-02 DIAGNOSIS — E039 Hypothyroidism, unspecified: Secondary | ICD-10-CM

## 2018-12-02 DIAGNOSIS — E871 Hypo-osmolality and hyponatremia: Secondary | ICD-10-CM | POA: Diagnosis not present

## 2018-12-03 DIAGNOSIS — I517 Cardiomegaly: Secondary | ICD-10-CM

## 2018-12-03 DIAGNOSIS — E871 Hypo-osmolality and hyponatremia: Secondary | ICD-10-CM | POA: Diagnosis not present

## 2018-12-03 DIAGNOSIS — R262 Difficulty in walking, not elsewhere classified: Secondary | ICD-10-CM | POA: Diagnosis not present

## 2018-12-03 DIAGNOSIS — I10 Essential (primary) hypertension: Secondary | ICD-10-CM | POA: Diagnosis not present

## 2018-12-03 DIAGNOSIS — I4892 Unspecified atrial flutter: Secondary | ICD-10-CM | POA: Diagnosis not present

## 2018-12-04 DIAGNOSIS — I2510 Atherosclerotic heart disease of native coronary artery without angina pectoris: Secondary | ICD-10-CM | POA: Diagnosis not present

## 2018-12-04 DIAGNOSIS — R001 Bradycardia, unspecified: Secondary | ICD-10-CM

## 2018-12-04 DIAGNOSIS — I10 Essential (primary) hypertension: Secondary | ICD-10-CM | POA: Diagnosis not present

## 2018-12-04 DIAGNOSIS — I4892 Unspecified atrial flutter: Secondary | ICD-10-CM

## 2018-12-04 DIAGNOSIS — R531 Weakness: Secondary | ICD-10-CM

## 2018-12-04 DIAGNOSIS — R262 Difficulty in walking, not elsewhere classified: Secondary | ICD-10-CM | POA: Diagnosis not present

## 2018-12-04 DIAGNOSIS — E871 Hypo-osmolality and hyponatremia: Secondary | ICD-10-CM | POA: Diagnosis not present

## 2020-07-23 DIAGNOSIS — I517 Cardiomegaly: Secondary | ICD-10-CM | POA: Diagnosis not present

## 2020-09-04 DIAGNOSIS — I484 Atypical atrial flutter: Secondary | ICD-10-CM | POA: Diagnosis not present

## 2020-11-11 DEATH — deceased

## 2020-12-11 DEATH — deceased
